# Patient Record
Sex: Male | Born: 1960 | Race: White | Hispanic: No | Smoking: Current every day smoker
Health system: Southern US, Community
[De-identification: ages and names within clinical notes are randomized; demographics above are authoritative.]

## PROBLEM LIST (undated history)

## (undated) DIAGNOSIS — Z972 Presence of dental prosthetic device (complete) (partial): Secondary | ICD-10-CM

## (undated) DIAGNOSIS — M1990 Unspecified osteoarthritis, unspecified site: Secondary | ICD-10-CM

## (undated) DIAGNOSIS — G4730 Sleep apnea, unspecified: Secondary | ICD-10-CM

## (undated) DIAGNOSIS — I10 Essential (primary) hypertension: Secondary | ICD-10-CM

## (undated) DIAGNOSIS — F419 Anxiety disorder, unspecified: Secondary | ICD-10-CM

## (undated) DIAGNOSIS — F319 Bipolar disorder, unspecified: Secondary | ICD-10-CM

## (undated) DIAGNOSIS — E6609 Other obesity due to excess calories: Secondary | ICD-10-CM

## (undated) DIAGNOSIS — E785 Hyperlipidemia, unspecified: Secondary | ICD-10-CM

## (undated) DIAGNOSIS — N529 Male erectile dysfunction, unspecified: Secondary | ICD-10-CM

## (undated) DIAGNOSIS — M5440 Lumbago with sciatica, unspecified side: Secondary | ICD-10-CM

## (undated) DIAGNOSIS — F329 Major depressive disorder, single episode, unspecified: Secondary | ICD-10-CM

## (undated) DIAGNOSIS — J45909 Unspecified asthma, uncomplicated: Secondary | ICD-10-CM

## (undated) DIAGNOSIS — F32A Depression, unspecified: Secondary | ICD-10-CM

## (undated) HISTORY — PX: HERNIA REPAIR: SHX51

## (undated) HISTORY — DX: Other obesity due to excess calories: E66.09

## (undated) HISTORY — PX: WRIST SURGERY: SHX841

## (undated) HISTORY — PX: JOINT REPLACEMENT: SHX530

## (undated) HISTORY — DX: Male erectile dysfunction, unspecified: N52.9

## (undated) HISTORY — PX: OTHER SURGICAL HISTORY: SHX169

## (undated) HISTORY — PX: BACK SURGERY: SHX140

## (undated) HISTORY — DX: Lumbago with sciatica, unspecified side: M54.40

## (undated) HISTORY — DX: Essential (primary) hypertension: I10

---

## 1998-01-19 ENCOUNTER — Emergency Department (HOSPITAL_COMMUNITY): Admission: EM | Admit: 1998-01-19 | Discharge: 1998-01-19 | Payer: Self-pay | Admitting: Emergency Medicine

## 1998-06-02 ENCOUNTER — Emergency Department (HOSPITAL_COMMUNITY): Admission: EM | Admit: 1998-06-02 | Discharge: 1998-06-02 | Payer: Self-pay | Admitting: Emergency Medicine

## 1998-06-02 ENCOUNTER — Encounter: Payer: Self-pay | Admitting: Emergency Medicine

## 2002-03-14 ENCOUNTER — Ambulatory Visit (HOSPITAL_BASED_OUTPATIENT_CLINIC_OR_DEPARTMENT_OTHER): Admission: RE | Admit: 2002-03-14 | Discharge: 2002-03-14 | Payer: Self-pay | Admitting: Orthopedic Surgery

## 2003-12-26 ENCOUNTER — Emergency Department (HOSPITAL_COMMUNITY): Admission: EM | Admit: 2003-12-26 | Discharge: 2003-12-26 | Payer: Self-pay | Admitting: Emergency Medicine

## 2004-03-10 ENCOUNTER — Ambulatory Visit (HOSPITAL_COMMUNITY): Admission: RE | Admit: 2004-03-10 | Discharge: 2004-03-10 | Payer: Self-pay | Admitting: Orthopaedic Surgery

## 2004-03-10 ENCOUNTER — Ambulatory Visit (HOSPITAL_BASED_OUTPATIENT_CLINIC_OR_DEPARTMENT_OTHER): Admission: RE | Admit: 2004-03-10 | Discharge: 2004-03-10 | Payer: Self-pay | Admitting: Orthopaedic Surgery

## 2004-05-28 ENCOUNTER — Encounter: Admission: RE | Admit: 2004-05-28 | Discharge: 2004-05-28 | Payer: Self-pay | Admitting: Neurosurgery

## 2006-04-07 ENCOUNTER — Inpatient Hospital Stay (HOSPITAL_COMMUNITY): Admission: RE | Admit: 2006-04-07 | Discharge: 2006-04-09 | Payer: Self-pay | Admitting: Neurosurgery

## 2006-06-29 ENCOUNTER — Encounter: Admission: RE | Admit: 2006-06-29 | Discharge: 2006-06-29 | Payer: Self-pay | Admitting: Neurosurgery

## 2007-03-03 ENCOUNTER — Ambulatory Visit (HOSPITAL_COMMUNITY): Admission: RE | Admit: 2007-03-03 | Discharge: 2007-03-03 | Payer: Self-pay | Admitting: Specialist

## 2007-06-30 ENCOUNTER — Encounter: Admission: RE | Admit: 2007-06-30 | Discharge: 2007-06-30 | Payer: Self-pay | Admitting: Specialist

## 2007-08-11 ENCOUNTER — Encounter: Admission: RE | Admit: 2007-08-11 | Discharge: 2007-08-11 | Payer: Self-pay | Admitting: Specialist

## 2007-08-23 ENCOUNTER — Encounter: Admission: RE | Admit: 2007-08-23 | Discharge: 2007-08-23 | Payer: Self-pay | Admitting: Specialist

## 2007-11-03 ENCOUNTER — Inpatient Hospital Stay (HOSPITAL_COMMUNITY): Admission: RE | Admit: 2007-11-03 | Discharge: 2007-11-04 | Payer: Self-pay | Admitting: Neurosurgery

## 2008-04-17 ENCOUNTER — Encounter: Admission: RE | Admit: 2008-04-17 | Discharge: 2008-04-17 | Payer: Self-pay | Admitting: Neurosurgery

## 2008-12-02 ENCOUNTER — Encounter: Admission: RE | Admit: 2008-12-02 | Discharge: 2008-12-02 | Payer: Self-pay | Admitting: Neurosurgery

## 2009-04-21 ENCOUNTER — Encounter: Admission: RE | Admit: 2009-04-21 | Discharge: 2009-04-21 | Payer: Self-pay | Admitting: Neurosurgery

## 2010-11-24 NOTE — Op Note (Signed)
NAME:  Peter [name], Peter [name]               ACCOUNT NO.:  [id number]   MEDICAL RECORD NO.:  [id number]          PATIENT TYPE:  INP   LOCATION:  3037                         FACILITY:  [facility]   PHYSICIAN:  Reinaldo Meeker, M.D. 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   PROCEDURE:  Exploration of fusion L4-L5 and L5-S1 and exploration of  hardware L4-L5 and L5-S1 and posterolateral fusion L4-L5 and L5-S1 with  BMP and Osteocel plus.   SURGEON:  Reinaldo Meeker, M.D.   ASSISTANT:  Donalee Citrin, M.D.   PROCEDURE IN DETAIL:  After being placed in the prone position, the  patient's back was prepped and draped in the usual sterile fashion.  The  previous lumbar incision was reopened and carried down towards the  fascia.  Dissection was then carried out laterally where the screw heads  and rod could be palpated.  The entire instrumentation system  bilaterally was dissected free of all soft tissue so it could be easily  evaluated including the crosslink.  The sacrum and far lateral region  was also dissected free of soft tissue so that the transverse processes  of L4-L5 and the far lateral region of S1 could be identified  bilaterally.  There was found to be a great amount the posterolateral  bony overgrowth which was actually encompassing the screws, particularly  at S1 on the left and L5-S1 on the left.  There was also a nice bony  fusion mass growing along the lateral aspect of L4-L5 and L5-S1 on the  right.  The screws were tested and found to be very solid with no  evidence of instability at either level.  It was therefore elected to  remove the bone from off the screws and remove the instrumentation  system which appeared to be well incorporated rather to redo and extend  the  posterolateral fusion.  Therefore, the transverse processes of L4  and L5 bilaterally were decorticated along with the far lateral region  of the sacrum.  The fusion bony mass was also mildly decorticated.  Once  this was done, large amounts of irrigation were carried out.  A  combination of BMP and Osteocel plus was then placed in the far lateral  region for posterolateral fusion from L4-L5 and L5-S1 bilaterally.  At  this time, the self-retaining retractor was removed, and a drain left in  the epidural space and brought out through a separate stab wound  incision.  The wound was then closed in multiple layers of Vicryl on the  muscle, fascia, subcutaneous, and subcuticular tissues.  Staples were  placed on the skin.  A sterile dressing was then applied, and the  patient was extubated and taken to the recovery room in stable  condition.           ______________________________  Reinaldo Meeker, M.D.     ROK/MEDQ  D:  11/03/2007  T:  11/04/2007  Job:  [id number]

## 2010-11-27 NOTE — Op Note (Signed)
NAME:  Peter [name], Peter [name]                         ACCOUNT NO.:  [id number]   MEDICAL RECORD NO.:  [id number]                   PATIENT TYPE:  AMB   LOCATION:  DSC                                  FACILITY:  [facility]   PHYSICIAN:  Loreta Ave, M.D.              DATE OF BIRTH:  Mar 29, 1961   DATE OF PROCEDURE:  03/15/2002  DATE OF DISCHARGE:                                 OPERATIVE REPORT   PREOPERATIVE DIAGNOSIS:  Puncture left forearm with residual compression  median nerve mid forearm volarly.  Also carpal tunnel syndrome.   POSTOPERATIVE DIAGNOSIS:  Puncture left forearm with residual compression  median nerve mid forearm volarly.  Also carpal tunnel syndrome.   OPERATION:  1. Left carpal tunnel release.  2. Expiration decompression median nerve mid forearm volarly at site of     puncture wound.   SURGEON:  Loreta Ave, M.D.   ASSISTANT:  Arlys John D. Petrarca, P.[name].-C.   ANESTHESIA:  General   ESTIMATED BLOOD LOSS:  Minimal   TOURNIQUET TIME:  35 minutes   SPECIMENS:  None   CULTURES:  None.   COMPLICATIONS:  None   DRESSING:  Soft compressive with bulky hand dressing and splint.   DESCRIPTION OF PROCEDURE:  The patient was brought to the operating room and  after adequate anesthesia had been obtained.  The left arm was prepped and  draped in the usual sterile fashion with [name] tourniquet on the upper aspect of  the arm.  Exsanguinated with elevation of an Esmarch and tourniquet inflated  to 250 mmHg.  Attention was turned to the carpal tunnel first.  [name] small  curved incision made over the carpal tunnel adjacent to the thenar eminence  heading slightly ulnarward at the distal ridge crease.  Skin and  subcutaneous tissue was divided.  Carpal tunnel decompressed in its entirety  with release of the overlying fascia from the palmar arch distally to the  forearm proximally.  Some constriction on the nerve but improved after  decompression.  Digital branch and motor  branches were identified and  protected decompressed.  No other abnormal findings.  The wound was  irrigated.  At the site of the puncture wound which was over the volar  aspect of the forearm junction middle distal third, I made [name] longitudinal  incision right over the area of puncture wound.  Skin and subcutaneous  tissue was divided.  The muscle from the superficial flexors was just below  this area and was detected.  This was mobilized and retracted exposing the  median nerve below this.  There was some scarring and fibrotic bands over  the nerve in that area.  All of this thoroughly decompressed.  I exposed the  nerve distal and proximal to be sure there was no other constriction on the  nerve itself.  There was no evidence of injury to the nerve in regards to  penetration  from its previous trauma.  After I felt that we had completely  decompressed the area, the wound was irrigated.  Both wounds were then  closed at the skin level with nylon.  Margins of the wound injected with  Marcaine.  Sterile compressive dressing with short arm splint and bulky hand  dressing applied.  Tourniquet then removed.  Anesthesia reversed.  Brought  to the recovery room.  Tolerated the surgery well with no complications.                                                Loreta Ave, M.D.    DFM/MEDQ  D:  03/15/2002  T:  03/15/2002  Job:  [id number]

## 2010-11-27 NOTE — Op Note (Signed)
NAME:  Peter [name], Peter [name]                         ACCOUNT NO.:  [id number]   MEDICAL RECORD NO.:  [id number]                   PATIENT TYPE:  AMB   LOCATION:  DSC                                  FACILITY:  [facility]   PHYSICIAN:  Lubertha Basque. Jerl Santos, M.D.             DATE OF BIRTH:  Feb 21, 1961   DATE OF PROCEDURE:  03/10/2004  DATE OF DISCHARGE:                                 OPERATIVE REPORT   PREOPERATIVE DIAGNOSIS:  Left knee torn lateral meniscus.   POSTOPERATIVE DIAGNOSIS:  Left knee torn lateral meniscus.   PROCEDURE:  Left knee arthroscopic partial lateral meniscectomy.   ANESTHESIA:  General.   SURGEON:  Lubertha Basque. Jerl Santos, M.D.   ASSISTANT:  Lindwood Qua, P.[name].   INDICATIONS FOR PROCEDURE:  The patient is [name] 50 year old man with several  months of left knee pain.  This has persisted despite oral anti-  inflammatories and activity restriction. He has undergone an MRI scan which  shows [name] lateral meniscus tear.  He has pain at rest and pain with activities  and was offered an arthroscopy.  Informed operative consent was obtained  after discussion of the possible complications of, reactions to anesthesia  and infection.   DESCRIPTION OF PROCEDURE:  The patient was taken to the operating suite  where general anesthetic was applied without difficulty.  He was positioned  supine and prepped and draped in normal sterile fashion.  After the  administration of preoperative antibiotics, an arthroscopy of left knee was  performed through two inferior portals.  The suprapatellar pouch was benign  as was the patellofemoral joint.  The medial compartment exhibited no  evidence of meniscal articular cartilage injury and the ACL was intact.  In  the lateral compartment, he had [name] displaced tear of the lateral meniscus  posterior and middle horn. [name] 15% partial lateral meniscectomy was performed  back to stable tissues.  This was done with the shaver and baskets.  He had  no degenerative  changes in this compartment.  The knee was thoroughly  irrigated followed by placement of Marcaine with epinephrine and morphine.  Adaptic was placed over the portals followed by dry gauze and [name] loose ace  wrap.  Estimated blood loss and intraoperative fluids can be obtained from  the anesthesia records.   DISPOSITION:  The patient was extubated in the operating room and taken to  the recovery room stable.  Plans are for him to go home the same day and  follow-up in the office in less than [name] week.  I will contact him by phone  tonight.                                               Lubertha Basque Jerl Santos, M.D.    PGD/MEDQ  D:  03/10/2004  T:  03/10/2004  Job:  [id number]

## 2010-11-27 NOTE — Op Note (Signed)
NAME:  Peter [name], Peter [name]               ACCOUNT NO.:  [id number]   MEDICAL RECORD NO.:  [id number]          PATIENT TYPE:  INP   LOCATION:  2899                         FACILITY:  [facility]   PHYSICIAN:  Reinaldo Meeker, M.D. 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   POSTOPERATIVE DIAGNOSIS:  Herniated disk and degenerative disease, L4-5, L5-  S1.   PROCEDURES:  1. L4-5 and L5-S1 decompressive laminectomy, followed by bilateral L4-5      and L5-S1 microdiskectomy, followed by L4-5 and L5-S1 posterior lumbar      interbody fusion with a Nuvasive bony spacer and PEEK interbody cages      at each level, followed by segmental instrumentation, L4-5, L5-S1, with      pedicle screw instrumentation, followed by L4-5 and L5-S1      posterolateral fusion.  2. Microdissection, L4, L5 and S1 nerve roots, as well as L4-5 and L5-S1      disk space bilaterally.   SURGEON:  Reinaldo Meeker, MD   ASSESSMENT:  Tia Alert, MD   PROCEDURE IN DETAIL:  After being placed in the prone position, the  patient's back was prepped and draped in the usual sterile fashion.  Localizing x-ray was taken prior to incision to identify the appropriate  level.  A midline incision was made over the spinous process L3, L4, L5 and  S1.  Using Bovie cutting current, the incision was carried down the spinous  processes.  Subperiosteal dissection was then carried out bilaterally on the  spinous processes, lamina and facet joints and in the far lateral region to  identify the transverse processes of L4 and L5 bilaterally.  A self-  retaining retractor was placed for exposure and x-ray showed approach to the  appropriate level.  Interspinous ligament was removed at this time.  Spinous  processes were then removed of L4, L5 and S1 and the bone saved for  use  later in the case.  Starting on the patient's left side, a very generous  laminotomy was performed by removing the inferior one-half of the L4-5  lamina, the medial two-thirds of the facet joint and the superior one-half  of the S1 lamina.  It was then used to remove the inferior one-half of L4,  the medial two-thirds the L4-5 facet, and the residual L5 lamina.  Residual  bone was removed and saved for use later in the case, in the L4 nerve root  was identified and the L5 nerve retracted completely out its foramen and the  S1 nerve root tracked out as well.  A similar decompression was then carried  out on the opposite side, once again doing a complete laminectomy of L5 as  well as removing the inferior half of L4 and the superior half of S1.  Once  again the medial two-thirds of the facet joints were removed at L4-5 and L5-  S1.  At this time bilateral  microdiskectomy was carried out at L4-5 and L5-  S1.  A thorough disk space clean-out was carried out bilaterally while at  the same time great care was taken to avoid injury to the neural elements.  This was successfully done.  At this time the disk space was prepared for  posterior lumbar interbody fusion.  A 12 mm distractor was placed at L4-5.  On the opposite side a rotating scraper was used, followed by chiseling with  the 12 x 9-mm chisel.  A bony spacer was initially put in.  On the opposite  side a similar procedure was carried out.  Prior to placing the second  spacer, autologous bone graft and BMP were placed in the midline.  On the  second side a PEEK interbody spacer filled with Master Graft and autologous  bone was placed into good position.  This was confirmed with fluoroscopy.  Similar interbody fusion was then carried out at L5-S1.  Once again  distraction with a 12 mm distractor was carried out.  Once again a circular  cutter was carried out followed by chiseling with a 12 x 9-mm chisel.  Once  again a Nuvasive bony  spacer was placed on the first side and a PEEK  interbody cage on the second.  Once again, autologous bone graft and BMP  were placed in the midline prior to placing the second spacer.  At this time  pedicle screw fixation was carried out bilaterally.  Entry points were  chosen and pedicle awl passed through the pedicle in a lateral to medial  trajectory.  Tapping was then carried out and 45 mm screws were placed at L4  and L5 and a 35 mm screw at S1.  A similar procedure was then carried on the  opposite side.  Screws were followed down with fluoroscopy.  At this time  large amounts of irrigation were carried out.  A high-speed drill was used  to decorticate the transverse processes and lateral facet joint,  posterolateral fusion was performed by using a mixture of BMP, autologous  bone and Master Graft putty.  An appropriate-length rod was then chosen.  This was secured with top-loading nuts.  Screws at S1 were locked and  sequential compression was carried out as the screws at L5-L4 underwent  their final compression and final locking.  A cross-link was then chosen and  secured in the standard fashion.  Final fluoroscopy in AP and lateral  direction showed the screws, rods, interbody __________ to all be in good  position.  Large amounts of irrigation was carried out.  Gelfoam was placed  over the dura for hemostasis along with bipolar coagulation.  A Hemovac  drain was brought out through a separate stab incision and left in the  epidural space.  The wound was then closed in multiple layers of Vicryl on  the muscle, fascia, subcutaneous and subcuticular tissues.  Staples were  placed on the skin.  A sterile dressing was then applied and the patient was  extubated, taken to the recovery room  in stable condition.           ______________________________  Reinaldo Meeker, M.D.     ROK/MEDQ  D:  04/07/2006  T:  04/09/2006  Job:  [id number]

## 2011-04-06 LAB — TYPE AND SCREEN
ABO/RH(D): O POS
Antibody Screen: NEGATIVE

## 2011-04-06 LAB — CBC
HCT: 45.8
Hemoglobin: 15.8
MCHC: 34.4
MCV: 93
Platelets: 213
RBC: 4.92
RDW: 13.7
WBC: 9.1

## 2011-07-30 ENCOUNTER — Other Ambulatory Visit: Payer: Self-pay | Admitting: Family Medicine

## 2011-07-30 DIAGNOSIS — E291 Testicular hypofunction: Secondary | ICD-10-CM

## 2011-08-06 ENCOUNTER — Ambulatory Visit
Admission: RE | Admit: 2011-08-06 | Discharge: 2011-08-06 | Disposition: A | Discharge status: Home / Self Care | Payer: Medicaid Other | Source: Ambulatory Visit | Attending: Family Medicine | Admitting: Family Medicine

## 2011-08-06 DIAGNOSIS — E291 Testicular hypofunction: Secondary | ICD-10-CM

## 2011-08-06 MED ORDER — GADOBENATE DIMEGLUMINE 529 MG/ML IV SOLN
10.0000 mL | Freq: Once | INTRAVENOUS | Status: AC | PRN
  Administered 2011-08-06: 10 mL via INTRAVENOUS

## 2011-12-14 ENCOUNTER — Emergency Department (HOSPITAL_COMMUNITY): Payer: Medicare Other

## 2011-12-14 ENCOUNTER — Encounter (HOSPITAL_COMMUNITY): Payer: Self-pay | Admitting: Emergency Medicine

## 2011-12-14 ENCOUNTER — Emergency Department (HOSPITAL_COMMUNITY)
Admission: EM | Admit: 2011-12-14 | Discharge: 2011-12-14 | Disposition: A | Discharge status: Home / Self Care | Payer: Medicare Other | Attending: Emergency Medicine | Admitting: Emergency Medicine

## 2011-12-14 DIAGNOSIS — M5412 Radiculopathy, cervical region: Secondary | ICD-10-CM | POA: Insufficient documentation

## 2011-12-14 DIAGNOSIS — M25519 Pain in unspecified shoulder: Secondary | ICD-10-CM | POA: Insufficient documentation

## 2011-12-14 DIAGNOSIS — R209 Unspecified disturbances of skin sensation: Secondary | ICD-10-CM | POA: Insufficient documentation

## 2011-12-14 DIAGNOSIS — I10 Essential (primary) hypertension: Secondary | ICD-10-CM | POA: Insufficient documentation

## 2011-12-14 HISTORY — DX: Essential (primary) hypertension: I10

## 2011-12-14 MED ORDER — IBUPROFEN 800 MG PO TABS: 800.0000 mg | ORAL_TABLET | Freq: Three times a day (TID) | ORAL | Status: AC

## 2011-12-14 MED ORDER — PREDNISONE 20 MG PO TABS: 60.0000 mg | ORAL_TABLET | Freq: Every day | ORAL | Status: AC

## 2011-12-14 MED ORDER — OXYCODONE-ACETAMINOPHEN 5-325 MG PO TABS: 1.0000 | ORAL_TABLET | Freq: Four times a day (QID) | ORAL | Status: AC | PRN

## 2011-12-14 NOTE — Discharge Instructions (Signed)
Cervical Radiculopathy Cervical radiculopathy happens when a nerve in the neck is pinched or bruised by a slipped (herniated) disk or by arthritic changes in the bones of the cervical spine. This can occur due to an injury or as part of the normal aging process. Pressure on the cervical nerves can cause pain or numbness that runs from your neck all the way down into your arm and fingers. CAUSES  There are many possible causes, including:  Injury.   Muscle tightness in the neck from overuse.   Swollen, painful joints (arthritis).   Breakdown or degeneration in the bones and joints of the spine (spondylosis) due to aging.   Bone spurs that may develop near the cervical nerves.  SYMPTOMS  Symptoms include pain, weakness, or numbness in the affected arm and hand. Pain can be severe or irritating. Symptoms may be worse when extending or turning the neck. DIAGNOSIS  Your caregiver will ask about your symptoms and do a physical exam. He or she may test your strength and reflexes. X-rays, CT scans, and MRI scans may be needed in cases of injury or if the symptoms do not go away after a period of time. Electromyography (EMG) or nerve conduction testing may be done to study how your nerves and muscles are working. TREATMENT  Your caregiver may recommend certain exercises to help relieve your symptoms. Cervical radiculopathy can, and often does, get better with time and treatment. If your problems continue, treatment options may include:  Wearing a soft collar for short periods of time.   Physical therapy to strengthen the neck muscles.   Medicines, such as nonsteroidal anti-inflammatory drugs (NSAIDs), oral corticosteroids, or spinal injections.   Surgery. Different types of surgery may be done depending on the cause of your problems.  HOME CARE INSTRUCTIONS   Put ice on the affected area.   Put ice in a plastic bag.   Place a towel between your skin and the bag.   Leave the ice on for 15  to 20 minutes, 3 to 4 times a day or as directed by your caregiver.   Use a flat pillow when you sleep.   Only take over-the-counter or prescription medicines for pain, discomfort, or fever as directed by your caregiver.   If physical therapy was prescribed, follow your caregiver's directions.   If a soft collar was prescribed, use it as directed.  SEEK IMMEDIATE MEDICAL CARE IF:   Your pain gets much worse and cannot be controlled with medicines.   You have weakness or numbness in your hand, arm, face, or leg.   You have a high fever or a stiff, rigid neck.   You lose bowel or bladder control (incontinence).   You have trouble with walking, balance, or speaking.  MAKE SURE YOU:   Understand these instructions.   Will watch your condition.   Will get help right away if you are not doing well or get worse.  Document Released: 03/23/2001 Document Revised: 06/17/2011 Document Reviewed: 02/09/2011 [employer] Patient Information 2012 [employer]. 

## 2011-12-14 NOTE — ED Notes (Signed)
Onset 2-3 weeks ago right shoulder pain radiating to right upper extremity pain and numbness. Continued today pain now 7/10 numbness tingling.  Bilateral equal strong hand grasps.  Radial pulses bialteral +2 equal full sensation skin warm and dry.

## 2011-12-14 NOTE — ED Provider Notes (Signed)
History  Scribed for [employer]. Bernette Mayers, MD, the patient was seen in room STRE3/STRE3. This chart was scribed by Candelaria Stagers. The patient's care started at 1:03 PM    CSN: [id number]  Arrival date & time 12/14/11  1150   First MD Initiated Contact with Patient 12/14/11 1257      Chief Complaint  Patient presents with  . Shoulder Pain  . Numbness    HPI Peter [name] is a 51 y.o. male who presents to the Emergency Department complaining of right shoulder pain that started about 2 weeks ago starting in his neck and moving down his arm.  She states that the pain goes down his arm and is experiencing numbness sand tingling down to his fingertips.  Lifting his arm above his shoulders makes the pain better.  Pt denies injury or fall.  Pt is on disability from lower back surgery.  He has h/o of HTN.  He has taken pain medication with little relief of the shoulder pain.   Past Medical History  Diagnosis Date  . Hypertension     Past Surgical History  Procedure Date  . Wrist surgery   . Cosmetic surgery   . Back fusion     No family history on file.  History  Substance Use Topics  . Smoking status: Current Everyday Smoker  . Smokeless tobacco: Not on file  . Alcohol Use: No      Review of Systems  Musculoskeletal: Positive for arthralgias (right shoulder pain).  Neurological: Positive for numbness (numbness and tingling of right arm).  All other systems reviewed and are negative.    Allergies  Review of patient's allergies indicates no known allergies.  Home Medications   Current Outpatient Rx  Name Route Sig Dispense Refill  . AMITRIPTYLINE HCL 50 MG PO TABS Oral Take 50 mg by mouth at bedtime.    Marland Kitchen LISINOPRIL-HYDROCHLOROTHIAZIDE 20-25 MG PO TABS Oral Take 1 tablet by mouth daily.      BP 134/82  Pulse 57  Temp(Src) 97.4 F (36.3 C) (Oral)  Resp 18  SpO2 99%  Physical Exam  Nursing note and vitals reviewed. Constitutional: He is oriented to person,  place, and time. He appears well-developed and well-nourished. No distress.  HENT:  Head: Normocephalic and atraumatic.  Eyes: EOM are normal. Pupils are equal, round, and reactive to light.  Neck: Neck supple. No tracheal deviation present.  Cardiovascular: Normal rate and intact distal pulses.   Pulmonary/Chest: Effort normal. No respiratory distress.  Abdominal: Soft. He exhibits no distension.  Musculoskeletal: He exhibits tenderness (right shoulder).  Neurological: He is alert and oriented to person, place, and time.  Skin: Skin is warm and dry.  Psychiatric: He has a normal mood and affect. His behavior is normal.    ED Course  Procedures DIAGNOSTIC STUDIES: Oxygen Saturation is 99% on room air, normal by my interpretation.    COORDINATION OF CARE:     Labs Reviewed - No data to display Dg Shoulder Right  12/14/2011  *RADIOLOGY REPORT*  Clinical Data: Right shoulder pain, no injury  RIGHT SHOULDER - 2+ VIEW  Comparison: None.  Findings: No fracture or dislocation.  The glenohumeral and acromioclavicular joint spaces are preserved.  No evidence of calcific tendonitis.  Regional soft tissues are normal.  Limited visualization of the adjacent thorax is normal.  IMPRESSION: Normal radiographs of the right shoulder.  Original Report Authenticated By: Waynard Reeds, M.D.     No diagnosis found.  MDM  Pt with symptoms consistent with cervical radiculopathy. EKG and Xray ordered by Triage nurse. No concern for cardiac etiology. Symptoms ongoing for 2-3 weeks. Advised to followup with his prior spine surgeon, Dr. Gerlene Fee. Prednisone and Pain meds as needed.    Date: 12/14/2011  Rate: 54  Rhythm: sinus bradycardia  QRS Axis: normal  Intervals: normal  ST/T Wave abnormalities: normal  Conduction Disutrbances:none  Narrative Interpretation:   Old EKG Reviewed: unchanged    I personally performed the services described in the documentation, which were scribed in my  presence. The recorded information has been reviewed and considered.           Ishmel Acevedo B. Bernette Mayers, MD 12/14/11 1310

## 2011-12-22 ENCOUNTER — Other Ambulatory Visit: Payer: Self-pay | Admitting: Family Medicine

## 2011-12-22 DIAGNOSIS — M542 Cervicalgia: Secondary | ICD-10-CM

## 2011-12-22 DIAGNOSIS — R200 Anesthesia of skin: Secondary | ICD-10-CM

## 2011-12-23 ENCOUNTER — Ambulatory Visit
Admission: RE | Admit: 2011-12-23 | Discharge: 2011-12-23 | Disposition: A | Discharge status: Home / Self Care | Payer: Medicare Other | Source: Ambulatory Visit | Attending: Family Medicine | Admitting: Family Medicine

## 2011-12-23 DIAGNOSIS — R200 Anesthesia of skin: Secondary | ICD-10-CM

## 2011-12-23 DIAGNOSIS — M542 Cervicalgia: Secondary | ICD-10-CM

## 2012-02-16 ENCOUNTER — Encounter (HOSPITAL_COMMUNITY): Payer: Self-pay | Admitting: Respiratory Therapy

## 2012-02-22 DIAGNOSIS — M5412 Radiculopathy, cervical region: Secondary | ICD-10-CM

## 2012-02-22 HISTORY — DX: Radiculopathy, cervical region: M54.12

## 2012-02-23 ENCOUNTER — Encounter (HOSPITAL_COMMUNITY)
Admission: RE | Admit: 2012-02-23 | Discharge: 2012-02-23 | Disposition: A | Discharge status: Home / Self Care | Payer: Medicare Other | Source: Ambulatory Visit | Attending: Orthopedic Surgery | Admitting: Orthopedic Surgery

## 2012-02-23 ENCOUNTER — Encounter (HOSPITAL_COMMUNITY): Payer: Self-pay

## 2012-02-23 ENCOUNTER — Encounter (HOSPITAL_COMMUNITY): Admission: RE | Admit: 2012-02-23 | Payer: Medicare Other | Source: Ambulatory Visit

## 2012-02-23 ENCOUNTER — Ambulatory Visit (HOSPITAL_COMMUNITY): Admission: RE | Admit: 2012-02-23 | Payer: Medicare Other | Source: Ambulatory Visit

## 2012-02-23 ENCOUNTER — Encounter (HOSPITAL_COMMUNITY)
Admission: RE | Admit: 2012-02-23 | Discharge: 2012-02-23 | Disposition: A | Discharge status: Home / Self Care | Payer: Medicare Other | Source: Ambulatory Visit | Attending: Physician Assistant | Admitting: Physician Assistant

## 2012-02-23 LAB — BASIC METABOLIC PANEL
BUN: 10 mg/dL (ref 6–23)
CO2: 24 mEq/L (ref 19–32)
Calcium: 9.1 mg/dL (ref 8.4–10.5)
Chloride: 106 mEq/L (ref 96–112)
Creatinine, Ser: 0.96 mg/dL (ref 0.50–1.35)
GFR calc Af Amer: >90 mL/min (ref >90)
GFR calc non Af Amer: >90 mL/min (ref >90)
Glucose, Bld: 132 mg/dL — ABNORMAL HIGH (ref 70–99)
Potassium: 4.2 mEq/L (ref 3.5–5.1)
Sodium: 140 mEq/L (ref 135–145)

## 2012-02-23 LAB — CBC
HCT: 47.3 % (ref 39.0–52.0)
Hemoglobin: 16.6 g/dL (ref 13.0–17.0)
MCH: 33.7 pg (ref 26.0–34.0)
MCHC: 35.1 g/dL (ref 30.0–36.0)
MCV: 96.1 fL (ref 78.0–100.0)
Platelets: 196 10*3/uL (ref 150–400)
RBC: 4.92 MIL/uL (ref 4.22–5.81)
RDW: 14.8 % (ref 11.5–15.5)
WBC: 8.6 10*3/uL (ref 4.0–10.5)

## 2012-02-23 LAB — SURGICAL PCR SCREEN
MRSA, PCR: NEGATIVE
Staphylococcus aureus: NEGATIVE

## 2012-02-23 NOTE — Progress Notes (Signed)
Dr Jeannetta Nap called for current cxr.

## 2012-02-23 NOTE — Pre-Procedure Instructions (Signed)
20 Peter [name]  02/23/2012   Your procedure is scheduled on:  03/01/12  Report to [facility] at 1130 AM.  Call this number if you have problems the morning of surgery: [phone]   Remember:   Do not eat food:After Midnight.  Take these medicines the morning of surgery with A SIP OF WATER: *none   Do not wear jewelry, make-up or nail polish.  Do not wear lotions, powders, or perfumes. You may wear deodorant.  Do not shave 48 hours prior to surgery. Men may shave face and neck.  Do not bring valuables to the hospital.  Contacts, dentures or bridgework may not be worn into surgery.  Leave suitcase in the car. After surgery it may be brought to your room.  For patients admitted to the hospital, checkout time is 11:00 AM the day of discharge.   Patients discharged the day of surgery will not be allowed to drive home.  Name and phone number of your driver: family  Special Instructions: CHG Shower Use Special Wash: 1/2 bottle night before surgery and 1/2 bottle morning of surgery.   Please read over the following fact sheets that you were given: Pain Booklet, Coughing and Deep Breathing, Blood Transfusion Information, MRSA Information and Surgical Site Infection Prevention

## 2012-02-28 NOTE — H&P (Signed)
Peter [name] 02/21/2012 11:25 AM Location: [facility] Patient #: [id number] DOB: [birth year] Married / Language: [place] / Race: White Male   History of Present Illness(Christipher Rieger Dierdre Highman, PA-C; 02/26/2012 10:44 AM) The patient is a 51 year old male who comes in today for a preoperative History and Physical. The patient is scheduled for a ACDF C5-6, C6-7 for cervical radiculopathy (with right arm pain) to be performed by Dr. Debria Garret D. Shon Baton, MD at [facility] on Wednesday August 21 at 130PM . Please see the hospital record for complete dictated history and physical.    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 01/04/2012   Social History(Darl Brisbin J Aidin Doane, PA-C; 02/21/2012 11:26 AM) Tobacco use. Current every day smoker, Smokes 1 pack of cigarettes per day.   Medication History(Latessa Tillis J Aftan Vint, PA-C; 02/21/2012 11:29 AM) Amitriptyline HCl (50MG  Tablet, Oral two times daily) Active. Lisinopril-Hydrochlorothiazide (20-25MG  Tablet, Oral) Active.   Past Surgical History(Anushri Casalino J [facility], PA-C; 02/21/2012 12:02 PM) Spinal Fusion - Neck Spinal Fusion - Lower Back. x2 Arthroscopic Knee Surgery - Left Back Surgery. lumbar fusion x2 with disability  Note: Left arm   Vitals(Deundra Furber J Jaylenn Baiza, PA-C; 02/21/2012 11:26 AM) 02/21/2012 11:26 AM Weight: 222 lb Height: 71 in Body Surface Area: 2.25 m Body Mass Index: 30.96 kg/m  02/21/2012 11:25 AM Pulse: 68 (Regular) BP: 145/79 (Sitting, Left Arm, Standard)    Physical Exam(Brailyn Killion J Rakesh Dutko, PA-C; 02/26/2012 10:56 AM) The physical exam findings are as follows:   General General Appearance- pleasant. Not in acute distress. Orientation- Oriented X3. Build & Nutrition- Well nourished and Well developed. Posture- Normal posture.  Gait- Normal. Mental Status- Alert.   Integumentary General Characteristics:Surgical Scars- no surgical scar evidence of previous cervical surgery. Cervical Spine- Skin examination of the cervical spine is without deformity, skin lesions, lacerations or abrasions.   Head and Neck Neck Global Assessment- supple. no lymphadenopathy and no nucchal rigidty.   Eye Pupil- Bilateral- Normal, Direct reaction to light normal, Equal and Regular. Motion- Bilateral- EOMI.   Chest and Lung Exam Auscultation: Breath sounds:- Clear.   Cardiovascular Auscultation:Rhythm- Regular rate and rhythm. Heart Sounds- Normal heart sounds.   Abdomen Palpation/Percussion:Palpation and Percussion of the abdomen reveal - Non Tender, No Rebound tenderness and Soft.   Peripheral Vascular Upper Extremity: Palpation:Radial pulse- Bilateral- 2+.   Neurologic Sensation:Upper Extremity- Bilateral- sensation is intact in the upper extremity (decreased in C7 distribution on the right). Reflexes:Biceps Reflex- Bilateral- 2+. Brachioradialis Reflex- Bilateral- 2+. Triceps Reflex- Bilateral- 2+. Babinski- Bilateral- Babinski not present. Clonus- Bilateral- clonus not present. Hoffman's Sign- Bilateral- Hoffman's sign not present. Testing:Seated Straight Leg Raise- Bilateral- Seated straight leg raise negative.   Musculoskeletal Spine/Ribs/Pelvis Cervical Spine : Inspection and Palpation:Tenderness- no soft tissue tenderness to palpation and no bony tenderness to palpation. bony/soft tissue palpation of the cervical spine and shoulders does not recreate their typical pain. Strength and Tone: Strength:Deltoid- Bilateral- 5/5. Biceps- Bilateral- 5/5. Triceps- Left- 5/5. Right- 4/5. Wrist Extension- Bilateral- 5/5. Hand Grip- Bilateral- 5/5. Heel walk- Bilateral- able to heel walk without difficulty. Toe Walk- Bilateral- able to walk on toes  without difficulty. Heel-Toe Walk- Bilateral- able to heel-toe walk without difficulty. ROM- limited and painful. Pain:- neither flexion or extension is more painful than the other. Special Testing- axial compression test negative and cross chest impingement test negative. Non-Anatomic Signs- No non-anatomic signs present. Upper Extremity  Range of Motion:- No truesholder pain with IR/ER of the shoulders.   Assessment & Plan(Eurika Sandy J [facility], PA-C; 02/26/2012 10:51 AM) Cervical radiculopathy (723.4)  Note: unfortunately conservative measures consisting of observation, activity modifications and oral pain medications have failed to alleviate his symptoms and given the ongoing nature of his pain and the significant decrease in his quality of life, he wishes to proceed with surgery. Risks/benefits/alternatives to the procedure/expectations following the procedure have been reviewed with the patient by Dr. Shon Baton. He understands.   MRI of the cervical spine dated 12/23/11 demonstrates left foraminal encroachment at C5-6 from a disc protrusion and osteophyte. Right foraminal encroachment at C6-7 for the same reason. Please see the report for the specifics.   He is scheduled for his pre-op hospital and therapy visit for the collar on Wednesday. He has been medically cleared for surgery by Dr. Jeannetta Nap. All of his questions have been encouraged, addressed and answered. Plan, at this time, is to proceed with surgery as scheduled.   Signed electronically by Gwinda Maine, PA-C (02/26/2012 10:57 AM)  Peter [name] 01/24/2012 8:46 AM Location: [facility] Patient #: [id number] DOB: Mar 06, 1961 Married / Language: [place] / Race: White Male   History of Present Illness(Lori Zipporah Plants; 01/24/2012 8:57 AM) The patient is a 51 year old male who presents with neck pain. The patient is seen today in referral from Dr. Ethelene Hal . The patient reports symptoms involving the right lateral  neck which began 2 month(s) ago. The symptoms began without any known injury. Symptoms include neck pain (about the right lateral cervical region radiating into the right upper ext. to the level of the hand ), neck stiffness, numbness (right ) and weakness, while symptoms do not include headaches. Current treatment includes opioid analgesics (Morphine 60mg  per primary q 12hrs.), ice and heat. Prior to being seen today the patient was previously evaluated by a primary care provider (with referral to Dr. Ethelene Hal). Past evaluation has included cervical spine x-rays and cervical spine MRI (performed at GI ). Past treatment has included nonsteroidal anti-inflammatory drugs (Mobic ).    Subjective Transcription(DAHARI Sheela Stack, MD; 01/28/2012 8:48 AM)  REASON FOR CONSULTATION:  Neck and right arm pain.    Dinh is a very pleasant 51 year old gentleman who for the last two months has been having severe, progressive right arm pain and neck pain. He states that keeping his arm flexed at the elbow and over his head does relieve his symptoms. Anytime he has to reach out for something he gets horrific pain. This is preventing him from sleeping. He also notes weakness in the arm, difficulty holding onto things. As a result of this he initially saw my partner, Dr. Ethelene Hal, who discussed potential injection therapy but did not feel as though it would be helpful. Based on the MRI results he was referred to me for further work up and treatment.    The MRI was done on 12/23/11. It demonstrated a small left disc protrusion extending into the foramen with spurring and impingement of the left C6 nerve root. At C6-7 there was a disc herniation with more prominent foraminal stenosis but no cord signal change with C7 nerve compression on the right side.    Allergies(Lori W Randa Lynn; 01/24/2012 8:47 AM) No Known Drug Allergies. 01/04/2012   Social History(Lori W Randa Lynn; 01/24/2012 8:57 AM) Tobacco use. Smokes 1 pack of  cigarettes per day.   Medication History(Lori W Lamb; 01/24/2012 8:58 AM) Lisinopril-Hydrochlorothiazide (20-25MG  Tablet, Oral) Active. Amitriptyline  HCl (50MG  Tablet, Oral 2 TABS HS) Active. Morphine Sulfate ER (60MG  Tablet ER 12HR, Oral) Active. (q 12hrs)   Past Surgical History(Lori W Lamb; 01/24/2012 8:58 AM) Back Surgery. lumbar fusion x2 with disability   Objective Transcription(DAHARI Sheela Stack, MD; 01/28/2012 8:48 AM)  He is a pleasant gentleman who appears his stated age in no acute distress. He is alert and oriented times three. Downgoing toes on Babinski testing. No clonus. Symmetric deep tendon reflexes. Normal gait pattern. He has triceps weakness on the right side 4/5, numbness and dysesthesias in the C7 distribution on the right side. No neurological deficits on the left. The remainder of his muscle groups testing other than the triceps were intact. He has significant neck pain, positive Spurling's sign.    RADIOGRAPHS:  I did have x-rays which did show some slight degenerative changes at C5-6 and more advanced changes at C6-7.        Assessments Transcription(DAHARI D BROOKS, MD; 01/28/2012 8:48 AM)  At this point in time the patient's principle pain generator is the C6-7 level.    Plans Transcription(DAHARI Sheela Stack, MD; 01/28/2012 8:48 AM)  We did have a long discussion about management. He would like to proceed with surgery because of the loss of quality of life, pain and weakness. I do believe that he has a C7 radiculopathy that has been going on for greater than six weeks with no significant relief. At this point we have gone over the risks of surgery which include infection, bleeding, nerve damage, death, stroke, paralysis, failure to heal, need for further surgery, ongoing or worse pain, nonunion, hardware complications, throat pain, swallowing difficulties, hoarseness in the voice. The goal of surgery is reduction in his pain, improvement in his  strength. We have also had a long discussion about adjacent segment disease. I did tell him that by fusing the C6-7 level there is an increased stress transfer to the C5-6 level and it could break down and could be problematic in the future. However, at this point in time I do not think it is a pain generator since he does not have corresponding left cervical radiculopathy. After discussing the risks of one level versus two level he and his wife are very adamant about proceeding with both levels. I did tell him that even when we do two levels the level above that could break down. The 4-5 level is just showing a tiny central disc protrusion, no significant collapse. At this point in time he would like to proceed with a two level fusion. Given the fact that he is a positive nicotine user and he is having a multilevel procedure we will also use a bone stimulator postoperatively. All of the risks were reviewed. The patient was present for the dictation.      Miscellaneous Transcription(DAHARI Sheela Stack, MD; 01/28/2012 8:48 AM)  Alvy Beal, MD/MMK    T: 01/26/12  D: 01/24/12      Signed electronically by Alvy Beal, MD (01/28/2012 8:57 AM)

## 2012-02-29 MED ORDER — CEFAZOLIN SODIUM-DEXTROSE 2-3 GM-% IV SOLR
2.0000 g | INTRAVENOUS | Status: AC
  Administered 2012-03-01: 2 g via INTRAVENOUS
  Filled 2012-02-29: qty 50

## 2012-02-29 MED ORDER — LACTATED RINGERS IV SOLN
INTRAVENOUS | Status: DC
  Administered 2012-03-01: 13:00:00 via INTRAVENOUS

## 2012-03-01 ENCOUNTER — Encounter (HOSPITAL_COMMUNITY): Payer: Self-pay | Admitting: Certified Registered"

## 2012-03-01 ENCOUNTER — Encounter (HOSPITAL_COMMUNITY)
Admission: RE | Disposition: A | Discharge status: Home / Self Care | Payer: Self-pay | Source: Ambulatory Visit | Attending: Orthopedic Surgery

## 2012-03-01 ENCOUNTER — Ambulatory Visit (HOSPITAL_COMMUNITY): Payer: Medicare Other

## 2012-03-01 ENCOUNTER — Inpatient Hospital Stay (HOSPITAL_COMMUNITY): Payer: Medicare Other

## 2012-03-01 ENCOUNTER — Ambulatory Visit (HOSPITAL_COMMUNITY): Payer: Medicare Other | Admitting: Certified Registered"

## 2012-03-01 ENCOUNTER — Inpatient Hospital Stay (HOSPITAL_COMMUNITY)
Admission: RE | Admit: 2012-03-01 | Discharge: 2012-03-02 | DRG: 473 | Disposition: A | Discharge status: Home / Self Care | Payer: Medicare Other | Source: Ambulatory Visit | Attending: Orthopedic Surgery | Admitting: Orthopedic Surgery

## 2012-03-01 DIAGNOSIS — M5412 Radiculopathy, cervical region: Secondary | ICD-10-CM | POA: Diagnosis present

## 2012-03-01 DIAGNOSIS — M47812 Spondylosis without myelopathy or radiculopathy, cervical region: Principal | ICD-10-CM | POA: Diagnosis present

## 2012-03-01 DIAGNOSIS — M5030 Other cervical disc degeneration, unspecified cervical region: Secondary | ICD-10-CM | POA: Diagnosis present

## 2012-03-01 DIAGNOSIS — F17200 Nicotine dependence, unspecified, uncomplicated: Secondary | ICD-10-CM | POA: Diagnosis present

## 2012-03-01 HISTORY — PX: ANTERIOR CERVICAL DECOMP/DISCECTOMY FUSION: SHX1161

## 2012-03-01 SURGERY — ANTERIOR CERVICAL DECOMPRESSION/DISCECTOMY FUSION 2 LEVELS
Anesthesia: General | Site: Neck | Wound class: Clean

## 2012-03-01 MED ORDER — THROMBIN 20000 UNITS EX SOLR
CUTANEOUS | Status: AC
  Filled 2012-03-01: qty 20000

## 2012-03-01 MED ORDER — 0.9 % SODIUM CHLORIDE (POUR BTL) OPTIME
TOPICAL | Status: DC | PRN
  Administered 2012-03-01: 1000 mL

## 2012-03-01 MED ORDER — MORPHINE SULFATE 2 MG/ML IJ SOLN: 1.0000 mg | INTRAMUSCULAR | Status: DC | PRN

## 2012-03-01 MED ORDER — HYDROMORPHONE HCL PF 1 MG/ML IJ SOLN
0.2500 mg | INTRAMUSCULAR | Status: DC | PRN
  Administered 2012-03-01 (×4): 0.5 mg via INTRAVENOUS

## 2012-03-01 MED ORDER — SODIUM CHLORIDE 0.9 % IJ SOLN
3.0000 mL | Freq: Two times a day (BID) | INTRAMUSCULAR | Status: DC
  Administered 2012-03-01: 3 mL via INTRAVENOUS

## 2012-03-01 MED ORDER — GLYCOPYRROLATE 0.2 MG/ML IJ SOLN
INTRAMUSCULAR | Status: DC | PRN
  Administered 2012-03-01: .5 mg via INTRAVENOUS

## 2012-03-01 MED ORDER — ZOLPIDEM TARTRATE 5 MG PO TABS: 5.0000 mg | ORAL_TABLET | Freq: Every evening | ORAL | Status: DC | PRN

## 2012-03-01 MED ORDER — HYDROMORPHONE HCL PF 1 MG/ML IJ SOLN
INTRAMUSCULAR | Status: AC
  Filled 2012-03-01: qty 1

## 2012-03-01 MED ORDER — AMITRIPTYLINE HCL 100 MG PO TABS
100.0000 mg | ORAL_TABLET | Freq: Every day | ORAL | Status: DC
  Administered 2012-03-01: 100 mg via ORAL
  Filled 2012-03-01 (×2): qty 1

## 2012-03-01 MED ORDER — MIDAZOLAM HCL 5 MG/5ML IJ SOLN
INTRAMUSCULAR | Status: DC | PRN
  Administered 2012-03-01: 2 mg via INTRAVENOUS

## 2012-03-01 MED ORDER — FENTANYL CITRATE 0.05 MG/ML IJ SOLN
INTRAMUSCULAR | Status: DC | PRN
  Administered 2012-03-01: 100 ug via INTRAVENOUS
  Administered 2012-03-01 (×6): 50 ug via INTRAVENOUS
  Administered 2012-03-01: 100 ug via INTRAVENOUS

## 2012-03-01 MED ORDER — PROPOFOL 10 MG/ML IV EMUL
INTRAVENOUS | Status: DC | PRN
  Administered 2012-03-01: 120 mg via INTRAVENOUS

## 2012-03-01 MED ORDER — PHENOL 1.4 % MT LIQD: 1.0000 | OROMUCOSAL | Status: DC | PRN

## 2012-03-01 MED ORDER — LACTATED RINGERS IV SOLN: INTRAVENOUS | Status: DC

## 2012-03-01 MED ORDER — DEXAMETHASONE 4 MG PO TABS
4.0000 mg | ORAL_TABLET | Freq: Four times a day (QID) | ORAL | Status: DC
  Administered 2012-03-02: 4 mg via ORAL
  Filled 2012-03-01 (×5): qty 1

## 2012-03-01 MED ORDER — METHOCARBAMOL 100 MG/ML IJ SOLN
500.0000 mg | Freq: Four times a day (QID) | INTRAVENOUS | Status: DC | PRN
  Administered 2012-03-01: 500 mg via INTRAVENOUS
  Filled 2012-03-01: qty 5

## 2012-03-01 MED ORDER — SODIUM CHLORIDE 0.9 % IJ SOLN: 3.0000 mL | INTRAMUSCULAR | Status: DC | PRN

## 2012-03-01 MED ORDER — MENTHOL 3 MG MT LOZG: 1.0000 | LOZENGE | OROMUCOSAL | Status: DC | PRN

## 2012-03-01 MED ORDER — HYDROCHLOROTHIAZIDE 25 MG PO TABS
25.0000 mg | ORAL_TABLET | Freq: Every day | ORAL | Status: DC
  Administered 2012-03-01 – 2012-03-02 (×2): 25 mg via ORAL
  Filled 2012-03-01 (×2): qty 1

## 2012-03-01 MED ORDER — METHOCARBAMOL 500 MG PO TABS
500.0000 mg | ORAL_TABLET | Freq: Four times a day (QID) | ORAL | Status: DC | PRN
  Administered 2012-03-01: 500 mg via ORAL
  Filled 2012-03-01 (×2): qty 1

## 2012-03-01 MED ORDER — NEOSTIGMINE METHYLSULFATE 1 MG/ML IJ SOLN
INTRAMUSCULAR | Status: DC | PRN
  Administered 2012-03-01: 2.5 mg via INTRAVENOUS

## 2012-03-01 MED ORDER — OXYCODONE HCL 5 MG PO TABS
10.0000 mg | ORAL_TABLET | ORAL | Status: DC | PRN
  Administered 2012-03-01 – 2012-03-02 (×3): 10 mg via ORAL
  Filled 2012-03-01 (×3): qty 2

## 2012-03-01 MED ORDER — SURGIFOAM 100 EX MISC
CUTANEOUS | Status: DC | PRN
  Administered 2012-03-01: 15:00:00 via TOPICAL

## 2012-03-01 MED ORDER — LISINOPRIL-HYDROCHLOROTHIAZIDE 20-25 MG PO TABS: 1.0000 | ORAL_TABLET | Freq: Every day | ORAL | Status: DC

## 2012-03-01 MED ORDER — ONDANSETRON HCL 4 MG/2ML IJ SOLN
INTRAMUSCULAR | Status: DC | PRN
  Administered 2012-03-01: 4 mg via INTRAVENOUS

## 2012-03-01 MED ORDER — LISINOPRIL 20 MG PO TABS
20.0000 mg | ORAL_TABLET | Freq: Every day | ORAL | Status: DC
  Administered 2012-03-01 – 2012-03-02 (×2): 20 mg via ORAL
  Filled 2012-03-01 (×2): qty 1

## 2012-03-01 MED ORDER — BUPIVACAINE-EPINEPHRINE PF 0.25-1:200000 % IJ SOLN
INTRAMUSCULAR | Status: AC
  Filled 2012-03-01: qty 30

## 2012-03-01 MED ORDER — ACETAMINOPHEN 10 MG/ML IV SOLN
1000.0000 mg | Freq: Four times a day (QID) | INTRAVENOUS | Status: DC
  Administered 2012-03-01 – 2012-03-02 (×3): 1000 mg via INTRAVENOUS
  Filled 2012-03-01 (×4): qty 100

## 2012-03-01 MED ORDER — LACTATED RINGERS IV SOLN
INTRAVENOUS | Status: DC | PRN
  Administered 2012-03-01 (×3): via INTRAVENOUS

## 2012-03-01 MED ORDER — DEXAMETHASONE SODIUM PHOSPHATE 10 MG/ML IJ SOLN
10.0000 mg | Freq: Once | INTRAMUSCULAR | Status: AC
  Administered 2012-03-01: 10 mg via INTRAVENOUS

## 2012-03-01 MED ORDER — LIDOCAINE HCL (CARDIAC) 20 MG/ML IV SOLN
INTRAVENOUS | Status: DC | PRN
  Administered 2012-03-01: 100 mg via INTRAVENOUS

## 2012-03-01 MED ORDER — DEXAMETHASONE SODIUM PHOSPHATE 4 MG/ML IJ SOLN
4.0000 mg | Freq: Four times a day (QID) | INTRAMUSCULAR | Status: DC
  Administered 2012-03-01: 4 mg via INTRAVENOUS
  Filled 2012-03-01 (×5): qty 1

## 2012-03-01 MED ORDER — CEFAZOLIN SODIUM 1-5 GM-% IV SOLN
1.0000 g | Freq: Three times a day (TID) | INTRAVENOUS | Status: AC
  Administered 2012-03-01 – 2012-03-02 (×2): 1 g via INTRAVENOUS
  Filled 2012-03-01 (×2): qty 50

## 2012-03-01 MED ORDER — PHENYLEPHRINE HCL 10 MG/ML IJ SOLN
INTRAMUSCULAR | Status: DC | PRN
  Administered 2012-03-01 (×2): 80 ug via INTRAVENOUS

## 2012-03-01 MED ORDER — BUPIVACAINE-EPINEPHRINE PF 0.25-1:200000 % IJ SOLN
INTRAMUSCULAR | Status: DC | PRN
  Administered 2012-03-01: 4 mL

## 2012-03-01 MED ORDER — ACETAMINOPHEN 10 MG/ML IV SOLN
INTRAVENOUS | Status: AC
  Filled 2012-03-01: qty 100

## 2012-03-01 MED ORDER — SODIUM CHLORIDE 0.9 % IV SOLN: 250.0000 mL | INTRAVENOUS | Status: DC

## 2012-03-01 MED ORDER — ACETAMINOPHEN 10 MG/ML IV SOLN
1000.0000 mg | Freq: Once | INTRAVENOUS | Status: AC
  Administered 2012-03-01: 1000 mg via INTRAVENOUS

## 2012-03-01 MED ORDER — ONDANSETRON HCL 4 MG/2ML IJ SOLN: 4.0000 mg | Freq: Once | INTRAMUSCULAR | Status: DC | PRN

## 2012-03-01 MED ORDER — ROCURONIUM BROMIDE 100 MG/10ML IV SOLN
INTRAVENOUS | Status: DC | PRN
  Administered 2012-03-01: 50 mg via INTRAVENOUS

## 2012-03-01 MED ORDER — PNEUMOCOCCAL VAC POLYVALENT 25 MCG/0.5ML IJ INJ
0.5000 mL | INJECTION | INTRAMUSCULAR | Status: AC
  Administered 2012-03-02: 0.5 mL via INTRAMUSCULAR
  Filled 2012-03-01 (×2): qty 0.5

## 2012-03-01 MED ORDER — ONDANSETRON HCL 4 MG/2ML IJ SOLN: 4.0000 mg | INTRAMUSCULAR | Status: DC | PRN

## 2012-03-01 SURGICAL SUPPLY — 67 items
ADH SKN CLS APL DERMABOND .7 (GAUZE/BANDAGES/DRESSINGS) ×1
BLADE SURG ROTATE 9660 (MISCELLANEOUS) IMPLANT
BUR EGG ELITE 4.0 (BURR) IMPLANT
BUR MATCHSTICK NEURO 3.0 LAGG (BURR) IMPLANT
CANISTER SUCTION 2500CC (MISCELLANEOUS) ×2 IMPLANT
CLOTH BEACON ORANGE TIMEOUT ST (SAFETY) ×2 IMPLANT
CLSR STERI-STRIP ANTIMIC 1/2X4 (GAUZE/BANDAGES/DRESSINGS) ×1 IMPLANT
COLLAR CERV LO CONTOUR FIRM DE (SOFTGOODS) IMPLANT
CORDS BIPOLAR (ELECTRODE) ×2 IMPLANT
COVER SURGICAL LIGHT HANDLE (MISCELLANEOUS) ×4 IMPLANT
CRADLE DONUT ADULT HEAD (MISCELLANEOUS) ×2 IMPLANT
DERMABOND ADVANCED (GAUZE/BANDAGES/DRESSINGS) ×1
DERMABOND ADVANCED .7 DNX12 (GAUZE/BANDAGES/DRESSINGS) ×1 IMPLANT
DEVICE ENDSKLTN TC MED 8MM (Orthopedic Implant) IMPLANT
DRAPE C-ARM 42X72 X-RAY (DRAPES) ×2 IMPLANT
DRAPE POUCH INSTRU U-SHP 10X18 (DRAPES) ×2 IMPLANT
DRAPE SURG 17X23 STRL (DRAPES) ×2 IMPLANT
DRAPE U-SHAPE 47X51 STRL (DRAPES) ×2 IMPLANT
DRSG MEPILEX BORDER 4X4 (GAUZE/BANDAGES/DRESSINGS) ×2 IMPLANT
DURAPREP 26ML APPLICATOR (WOUND CARE) ×2 IMPLANT
ELECT COATED BLADE 2.86 ST (ELECTRODE) ×2 IMPLANT
ELECT REM PT RETURN 9FT ADLT (ELECTROSURGICAL) ×2
ELECTRODE REM PT RTRN 9FT ADLT (ELECTROSURGICAL) ×1 IMPLANT
ENDOSKELTON TC IMPLANT 8MM MED (Orthopedic Implant) ×2 IMPLANT
GLOVE BIOGEL PI IND STRL 6.5 (GLOVE) ×1 IMPLANT
GLOVE BIOGEL PI IND STRL 8.5 (GLOVE) ×1 IMPLANT
GLOVE BIOGEL PI INDICATOR 6.5 (GLOVE) ×1
GLOVE BIOGEL PI INDICATOR 8.5 (GLOVE) ×1
GLOVE ECLIPSE 6.0 STRL STRAW (GLOVE) ×2 IMPLANT
GLOVE ECLIPSE 8.5 STRL (GLOVE) ×2 IMPLANT
GOWN PREVENTION PLUS XXLARGE (GOWN DISPOSABLE) ×2 IMPLANT
GOWN STRL NON-REIN LRG LVL3 (GOWN DISPOSABLE) ×4 IMPLANT
INTERLOCK LRDTC CRVCL VBR 8MM (Peek) IMPLANT
KIT BASIN OR (CUSTOM PROCEDURE TRAY) ×2 IMPLANT
KIT ROOM TURNOVER OR (KITS) ×2 IMPLANT
LORDOTIC CERVICAL VBR 8MM SM (Peek) ×2 IMPLANT
NDL SPNL 18GX3.5 QUINCKE PK (NEEDLE) ×1 IMPLANT
NEEDLE SPNL 18GX3.5 QUINCKE PK (NEEDLE) ×2 IMPLANT
NS IRRIG 1000ML POUR BTL (IV SOLUTION) ×2 IMPLANT
PACK ORTHO CERVICAL (CUSTOM PROCEDURE TRAY) ×2 IMPLANT
PACK UNIVERSAL I (CUSTOM PROCEDURE TRAY) ×2 IMPLANT
PAD ARMBOARD 7.5X6 YLW CONV (MISCELLANEOUS) ×4 IMPLANT
PLATE VECTRA 34MM (Plate) ×1 IMPLANT
PUTTY BONE DBX 5CC MIX (Putty) ×1 IMPLANT
RETAINER PIN ×2 IMPLANT
SCREW 4.0X14MM (Screw) ×8 IMPLANT
SCREW 4.0X16MM (Screw) ×2 IMPLANT
SCREW BN 14X4XSLF DRL VA SLF (Screw) IMPLANT
SPONGE INTESTINAL PEANUT (DISPOSABLE) IMPLANT
SPONGE LAP 4X18 X RAY DECT (DISPOSABLE) IMPLANT
SPONGE SURGIFOAM ABS GEL 100 (HEMOSTASIS) ×2 IMPLANT
STRIP CLOSURE SKIN 1/2X4 (GAUZE/BANDAGES/DRESSINGS) ×2 IMPLANT
SURGIFLO TRUKIT (HEMOSTASIS) IMPLANT
SUT MNCRL AB 3-0 PS2 18 (SUTURE) ×2 IMPLANT
SUT SILK 2 0 (SUTURE) ×2
SUT SILK 2-0 18XBRD TIE 12 (SUTURE) ×1 IMPLANT
SUT VIC AB 2-0 CT1 18 (SUTURE) ×2 IMPLANT
SUT VIC AB 3-0 54X BRD REEL (SUTURE) IMPLANT
SUT VIC AB 3-0 BRD 54 (SUTURE)
SYR BULB IRRIGATION 50ML (SYRINGE) ×2 IMPLANT
SYR CONTROL 10ML LL (SYRINGE) ×2 IMPLANT
TAPE CLOTH 4X10 WHT NS (GAUZE/BANDAGES/DRESSINGS) ×2 IMPLANT
TAPE UMBILICAL COTTON 1/8X30 (MISCELLANEOUS) ×2 IMPLANT
TOWEL OR 17X24 6PK STRL BLUE (TOWEL DISPOSABLE) ×2 IMPLANT
TOWEL OR 17X26 10 PK STRL BLUE (TOWEL DISPOSABLE) ×2 IMPLANT
TRAY FOLEY CATH 14FR (SET/KITS/TRAYS/PACK) IMPLANT
WATER STERILE IRR 1000ML POUR (IV SOLUTION) ×2 IMPLANT

## 2012-03-01 NOTE — Brief Op Note (Signed)
03/01/2012  4:40 PM  PATIENT:  Delray Alt  51 y.o. male  PRE-OPERATIVE DIAGNOSIS:  CERVICAL RADICULOPATHY  POST-OPERATIVE DIAGNOSIS:  CERVICAL RADICULOPATHY  PROCEDURE:  Procedure(s) (LRB): ANTERIOR CERVICAL DECOMPRESSION/DISCECTOMY FUSION 2 LEVELS (N/A)  SURGEON:  Surgeon(s) and Role:    * Venita Lick, MD - Primary  PHYSICIAN ASSISTANT:   ASSISTANTS: none   ANESTHESIA:   general  EBL:  Total I/O In: 2000 [I.V.:2000] Out: 100 [Blood:100]  BLOOD ADMINISTERED:none  DRAINS: none   LOCAL MEDICATIONS USED:  MARCAINE     SPECIMEN:  No Specimen  DISPOSITION OF SPECIMEN:  N/A  COUNTS:  YES  TOURNIQUET:  * No tourniquets in log *  DICTATION: .Other Dictation: Dictation Number [id number]  PLAN OF CARE: Admit for overnight observation  PATIENT DISPOSITION:  PACU - hemodynamically stable.

## 2012-03-01 NOTE — Anesthesia Postprocedure Evaluation (Signed)
Anesthesia Post Note  Patient: Peter [name]  Procedure(s) Performed: Procedure(s) (LRB): ANTERIOR CERVICAL DECOMPRESSION/DISCECTOMY FUSION 2 LEVELS (N/A)  Anesthesia type: general  Patient location: PACU  Post pain: Pain level controlled  Post assessment: Patient's Cardiovascular Status Stable  Last Vitals:  Filed Vitals:   03/01/12 1700  BP: 133/79  Pulse: 73  Temp:   Resp: 14    Post vital signs: Reviewed and stable  Level of consciousness: sedated  Complications: No apparent anesthesia complications

## 2012-03-01 NOTE — Anesthesia Procedure Notes (Signed)
Procedure Name: Intubation Date/Time: 03/01/2012 1:27 PM Performed by: Rossie Muskrat L Pre-anesthesia Checklist: Patient identified, Timeout performed, Emergency Drugs available, Suction available and Patient being monitored Patient Re-evaluated:Patient Re-evaluated prior to inductionOxygen Delivery Method: Circle system utilized Preoxygenation: Pre-oxygenation with 100% oxygen Intubation Type: IV induction Ventilation: Mask ventilation without difficulty and Oral airway inserted - appropriate to patient size Laryngoscope Size: Miller and 2 Grade View: Grade I Tube type: Oral Tube size: 7.5 mm Number of attempts: 1 Airway Equipment and Method: Stylet Placement Confirmation: ETT inserted through vocal cords under direct vision,  breath sounds checked- equal and bilateral and positive ETCO2 Secured at: 23 cm Tube secured with: Tape Dental Injury: Teeth and Oropharynx as per pre-operative assessment

## 2012-03-01 NOTE — Transfer of Care (Signed)
Immediate Anesthesia Transfer of Care Note  Patient: Peter [name]  Procedure(s) Performed: Procedure(s) (LRB): ANTERIOR CERVICAL DECOMPRESSION/DISCECTOMY FUSION 2 LEVELS (N/A)  Patient Location: PACU  Anesthesia Type: General  Level of Consciousness: awake, alert  and patient cooperative  Airway & Oxygen Therapy: Patient Spontanous Breathing and Patient connected to face mask oxygen  Post-op Assessment: Report given to PACU RN, Post -op Vital signs reviewed and stable and Patient moving all extremities  Post vital signs: Reviewed and stable  Complications: No apparent anesthesia complications

## 2012-03-01 NOTE — Anesthesia Preprocedure Evaluation (Signed)
Anesthesia Evaluation  Patient identified by MRN, date of birth, ID band Patient awake    Reviewed: Allergy & Precautions, H&P , NPO status , Patient's Chart, lab work & pertinent test results  Airway Mallampati: I TM Distance: >3 FB Neck ROM: Full    Dental   Pulmonary          Cardiovascular hypertension, Pt. on medications     Neuro/Psych    GI/Hepatic   Endo/Other    Renal/GU      Musculoskeletal   Abdominal   Peds  Hematology   Anesthesia Other Findings   Reproductive/Obstetrics                           Anesthesia Physical Anesthesia Plan  ASA: II  Anesthesia Plan: General   Post-op Pain Management:    Induction: Intravenous  Airway Management Planned: Oral ETT  Additional Equipment:   Intra-op Plan:   Post-operative Plan: Extubation in OR  Informed Consent: I have reviewed the patients History and Physical, chart, labs and discussed the procedure including the risks, benefits and alternatives for the proposed anesthesia with the patient or authorized representative who has indicated his/her understanding and acceptance.     Plan Discussed with: CRNA and Surgeon  Anesthesia Plan Comments:         Anesthesia Quick Evaluation  

## 2012-03-01 NOTE — Plan of Care (Signed)
Problem: Consults Goal: Diagnosis - Spinal Surgery Outcome: Completed/Met Date Met:  03/01/12 Cervical Spine Fusion     

## 2012-03-01 NOTE — Preoperative (Signed)
Beta Blockers   Reason not to administer Beta Blockers:Not Applicable 

## 2012-03-01 NOTE — H&P (Signed)
H+P reviewed  No change in clinical exam 

## 2012-03-02 ENCOUNTER — Encounter (HOSPITAL_COMMUNITY): Payer: Self-pay | Admitting: Orthopedic Surgery

## 2012-03-02 MED ORDER — POLYETHYLENE GLYCOL 3350 17 G PO PACK: 17.0000 g | PACK | Freq: Every day | ORAL | Status: AC

## 2012-03-02 MED ORDER — OXYCODONE-ACETAMINOPHEN 10-325 MG PO TABS: 1.0000 | ORAL_TABLET | Freq: Four times a day (QID) | ORAL | Status: AC | PRN

## 2012-03-02 MED ORDER — METHOCARBAMOL 500 MG PO TABS: 500.0000 mg | ORAL_TABLET | Freq: Three times a day (TID) | ORAL | Status: AC

## 2012-03-02 MED ORDER — ONDANSETRON HCL 4 MG PO TABS: 4.0000 mg | ORAL_TABLET | Freq: Three times a day (TID) | ORAL | Status: AC | PRN

## 2012-03-02 NOTE — Progress Notes (Signed)
UR COMPLETED  

## 2012-03-02 NOTE — Discharge Summary (Signed)
Patient ID: Peter [name] MRN: [id number] DOB/AGE: 08/06/1960 51 y.o.  Admit date: 03/01/2012 Discharge date: 03/02/2012  Admission Diagnoses:  Principal Problem:  *Cervical radiculopathy   Discharge Diagnoses:  Principal Problem:  *Cervical radiculopathy  status post Procedure(s): ANTERIOR CERVICAL DECOMPRESSION/DISCECTOMY FUSION 2 LEVELS  Past Medical History  Diagnosis Date  . Hypertension     Surgeries: Procedure(s): ANTERIOR CERVICAL DECOMPRESSION/DISCECTOMY FUSION 2 LEVELS on 03/01/2012   Consultants: none  Discharged Condition: Improved  Hospital Course: Peter [name] is an 51 y.o. male who was admitted 03/01/2012 for operative treatment of Cervical radiculopathy. Patient failed conservative treatments (please see the history and physical for the specifics) and had severe unremitting pain that affects sleep, daily activities and work/hobbies. After pre-op clearance, the patient was taken to the operating room on 03/01/2012 and underwent  Procedure(s): ANTERIOR CERVICAL DECOMPRESSION/DISCECTOMY FUSION 2 LEVELS.    Patient was given perioperative antibiotics: Anti-infectives     Start     Dose/Rate Route Frequency Ordered Stop   03/01/12 2130   ceFAZolin (ANCEF) IVPB 1 g/50 mL premix        1 g 100 mL/hr over 30 Minutes Intravenous Every 8 hours 03/01/12 1837 03/02/12 0539   02/29/12 1033   ceFAZolin (ANCEF) IVPB 2 g/50 mL premix        2 g 100 mL/hr over 30 Minutes Intravenous 60 min pre-op 02/29/12 1033 03/01/12 1328           Patient was given sequential compression devices and early ambulation to prevent DVT.   Patient benefited maximally from hospital stay and there were no complications. At the time of discharge, the patient was urinating/moving their bowels without difficulty, tolerating a regular diet, pain is controlled with oral pain medications and they have been cleared by PT/OT.   Recent vital signs: Patient Vitals for the past 24 hrs:  BP  Temp Temp src Pulse Resp SpO2  03/02/12 0821 136/86 mmHg 98.4 F (36.9 C) - 61  16  95 %  03/02/12 0338 159/93 mmHg 98.8 F (37.1 C) - 93  16  95 %  03/02/12 0003 143/77 mmHg 99.4 F (37.4 C) - 74  16  98 %  03/01/12 2051 142/73 mmHg 99.1 F (37.3 C) - 74  18  98 %  03/01/12 1830 161/87 mmHg 97.6 F (36.4 C) Oral 78  18  97 %  03/01/12 1815 141/83 mmHg 97 F (36.1 C) - 68  16  99 %  03/01/12 1800 138/79 mmHg - - 64  12  97 %  03/01/12 1745 143/81 mmHg - - 66  13  97 %  03/01/12 1730 135/83 mmHg - - 63  14  98 %  03/01/12 1715 139/70 mmHg - - 67  14  91 %  03/01/12 1700 133/79 mmHg - - 73  14  95 %  03/01/12 1655 - 97.6 F (36.4 C) - - - -  03/01/12 1206 123/76 mmHg 98.3 F (36.8 C) Oral 73  18  95 %     Recent laboratory studies: No results found for this basename: WBC:2,HGB:2,HCT:2,PLT:2,NA:2,K:2,CL:2,CO2:2,BUN:2,CREATININE:2,GLUCOSE:2,PT:2,INR:2,CALCIUM,2: in the last 72 hours   Discharge Medications:   Medication List  As of 03/02/2012  9:23 AM   STOP taking these medications         BC HEADACHE PO         TAKE these medications         amitriptyline 100 MG tablet   Commonly known as: ELAVIL  Take 100 mg by mouth at bedtime.      lisinopril-hydrochlorothiazide 20-25 MG per tablet   Commonly known as: PRINZIDE,ZESTORETIC   Take 1 tablet by mouth daily.      methocarbamol 500 MG tablet   Commonly known as: ROBAXIN   Take 1 tablet (500 mg total) by mouth 3 (three) times daily. MAX 3 pills daily      ondansetron 4 MG tablet   Commonly known as: ZOFRAN   Take 1 tablet (4 mg total) by mouth every 8 (eight) hours as needed for nausea. MAX 3 pills daily      oxyCODONE-acetaminophen 10-325 MG per tablet   Commonly known as: PERCOCET   Take 1 tablet by mouth every 6 (six) hours as needed for pain. MAX 4 pills daily      polyethylene glycol packet   Commonly known as: MIRALAX / GLYCOLAX   Take 17 g by mouth daily. Take 1 packet daily until bowels become regular              Diagnostic Studies: Dg Cervical Spine 2-3 Views  03/01/2012  *RADIOLOGY REPORT*  Clinical Data: Status post anterior cervical discectomy and fusion.  CERVICAL SPINE - 2-3 VIEW  Comparison: Intraoperative views same date.  Findings: Patient is status post C5-C7 ACDF.  The hardware position is stable.  The alignment is normal.  No complications are identified.  IMPRESSION: Intact hardware and stable alignment status post C5-C7 ACDF.   Original Report Authenticated By: Gerrianne Scale, M.D.    Dg Cervical Spine 2-3 Views  03/01/2012  *RADIOLOGY REPORT*  Clinical Data: Two level ACDF.  DG C-ARM 61-120 MIN,CERVICAL SPINE - 2-3 VIEW  Comparison: Cervical spine radiographs 02/23/2012.  Findings: Three spot fluoroscopic images of the cervical spine demonstrate interval anterior discectomy and fusion from C5 through C7 utilizing an anterior plate, screws and metallic intervertebral bone plugs.  The hardware appears well positioned.  No complications are identified.  IMPRESSION: Intraoperative views following C5-C7 ACDF.   Original Report Authenticated By: Gerrianne Scale, M.D.    Dg Cervical Spine 2-3 Views  02/23/2012  *RADIOLOGY REPORT*  Clinical Data: Preop anterior cervical decompression.  CERVICAL SPINE - 2-3 VIEW  Comparison: MRI 12/23/2011  Findings: Early degenerative changes at C6-7.  Normal alignment. The remainder of the disc spaces are maintained.  Prevertebral soft tissues are normal.  No fracture.  IMPRESSION: Early spondylosis C6-7.  Original Report Authenticated By: Cyndie Chime, M.D.   Dg C-arm [phone] Min  03/01/2012  *RADIOLOGY REPORT*  Clinical Data: Two level ACDF.  DG C-ARM 61-120 MIN,CERVICAL SPINE - 2-3 VIEW  Comparison: Cervical spine radiographs 02/23/2012.  Findings: Three spot fluoroscopic images of the cervical spine demonstrate interval anterior discectomy and fusion from C5 through C7 utilizing an anterior plate, screws and metallic intervertebral bone plugs.  The  hardware appears well positioned.  No complications are identified.  IMPRESSION: Intraoperative views following C5-C7 ACDF.   Original Report Authenticated By: Gerrianne Scale, M.D.     Discharge Orders    Future Orders Please Complete By Expires   Diet - low sodium heart healthy      Call MD / Call 911      Comments:   If you experience chest pain or shortness of breath, CALL 911 and be transported to the hospital emergency room.  If you develope a fever above 101 F, pus (white drainage) or increased drainage or redness at the wound, or calf pain, call your surgeon's  office.   Constipation Prevention      Comments:   Drink plenty of fluids.  Prune juice may be helpful.  You may use a stool softener, such as Colace (over the counter) 100 mg twice a day.  Use MiraLax (over the counter) for constipation as needed.   Increase activity slowly as tolerated      Discharge instructions      Comments:   Keep incision clean and dry.  Leave steri strips in place.  May shower 5 days from surgery; pat to dry following shower.  May redress with clean, dry dressing if you would like.  Do not apply any lotion/cream/ointment to the incision. Cervical collar may be removed for eating, sleeping and showering.   Driving restrictions      Comments:   No driving for 2 weeks.  Dr Shon Baton will discuss addition driving restrictions at your first post-op visit in 2 weeks.   Lifting restrictions      Comments:   No lifting anything greater than 5 pounds.  DO NOT reach overhead (above shoulder height).  NO bending, stooping or squatting.  Dr. Shon Baton will discuss additional lifting restrictions at your first post-op visit in 2 weeks.      Follow-up Information    Follow up with Alvy Beal, MD in 2 weeks.   Contact information:   [facility] [address] [place] [address] [phone]          Discharge Plan:  discharge to Home   Disposition: stable  at the time of discharge     Signed: Gwinda Maine for Dr. Venita Lick [facility] [phone] 03/02/2012, 9:23 AM

## 2012-03-02 NOTE — Op Note (Signed)
NAMEKAYCEN, WHITWORTH               ACCOUNT NO.:  [id number]  MEDICAL RECORD NO.:  [id number]  LOCATION:  3526                         FACILITY:  [facility]  PHYSICIAN:  Alvy Beal, MD    DATE OF BIRTH:  [birth year]  DATE OF PROCEDURE:  03/01/2012 DATE OF DISCHARGE:                              OPERATIVE REPORT   PREOPERATIVE DIAGNOSIS:  Cervical spondylotic radiculopathy, C5-6, C6-7.  POSTOPERATIVE DIAGNOSIS:  Cervical spondylotic radiculopathy, C5-6, C6- 7.  OPERATIVE PROCEDURE:  Anterior cervical diskectomy and fusion, C5-6, C6- 7.  COMPLICATIONS:  None.  CONDITION:  Stable.  HISTORY:  This is a very pleasant gentleman who is having progressive neck and radicular right arm pain in C6 and greater C7 distribution despite appropriate conservative management consisting of physical therapy, injection therapy, anti-inflammatory medications, activity modification, his overall quality of life continues to deteriorate.  As a result, we elected to proceed with surgery.  All appropriate risks, benefits, and alternatives were discussed with the patient and consent was obtained.  OPERATIVE NOTE:  The patient was brought to the operating room, placed supine on the operating table.  After successful induction of general anesthesia and endotracheal intubation, TEDs and SCDs were applied. Rolled towels were placed between the shoulder blades.  Shoulders themselves were taped down.  The anterior cervical spine was prepped and draped in a standard fashion.  Time-out was done to confirm patient, procedure, and all other pertinent important data.  Once this was done, I identified the C6 vertebral body with x-ray and then made a transverse incision starting at the midline and proceeding to the left.  Sharp dissection was carried out down to the platysma.  Platysma was then sharply incised and I identified the medial border of the sternocleidomastoid.  I then began sharply dissecting the  internervous plane between the omohyoid and the sternocleidomastoid.  I eventually was able to finger palpate down to the anterior cervical spine.  I identified the carotid sheath as well as the esophagus and then swept the esophagus and trachea to the right and protected it with an appendiceal retractor and then protected the carotid sheath with my finger.  At this point, I then used Kittner dissectors to remove the remaining prevertebral fascia to expose the entire anterior cervical spine.  The omohyoid was still crossing the field and causing some difficulty with traction, so I isolated it and transected it.  This provided excellent visualization.  I then placed a needle into the 5-6 disk space, took an intraoperative x- ray with fluoro and then confirmed I was at the appropriate level.  Using bipolar electrocautery, I mobilized the longus colli muscle from the midbody of C5 to the midbody of C7.  I then placed the Caspar retractor blades underneath the longus coli muscles, deflated the endotracheal cuff, expanded the retractor and then reinflated the endotracheal cuff.  I then placed distraction pins into the bodies of C7 and C6 and gently distracted the disk space.  A #15 blade scalpel was used to perform an annulotomy and then I used a combination of pituitary rongeurs, curettes, and Kerrison rongeurs to remove all of the disk material.  I then identified the small fragmented disk  that was posterolateral to the right and using a nerve hooks, I mobilized it and using a micropituitary rongeur, I removed it.  I then used a fine nerve hook to develop a plane underneath the posterior longitudinal ligament and then used a 1 mm Kerrison to resect the posterior longitudinal ligament.  This allowed me to keep 1 mm Kerrison under the uncovertebral joint and resect some of the osteophyte that had formed.  I also resected some of the osteophytes from the posterior aspect of the vertebral  bodies of 6 and 7.  At this point, I rasped the endplates until I had bleeding subchondral bone.  Then using trial devices, I elected to use an eight medium Titan titanium intervertebral cage packed with DBX mix.  This was prepared on the back table and malleted to the appropriate depth.  I had excellent purchase with this implant.  At this point, I removed the distraction pin from the body of C7, placed bone wax in the bleeding bone, and then repositioned it into the body of C5.  I then repositioned my retractors.  So, now I was exposing the 5-6 disk space.  I distracted the disk space and performed an annulotomy.  Using the same technique that I had just used at C6-7, I performed a similar diskectomy at C5-6.  At this time, there was a small left disk fragment, which was also resected using a fine nerve hooks.  Again, this correspond with the preoperative MRI.  I took down the posterior longitudinal ligament in the same fashion as well as the bone spurs.  The endplates were then rasped and at this time, I elected to use a small 8-mm lordotic Titan titanium intervertebral cage, packed with DBX mix.  At this point, distracted pins were removed and x-rays confirmed satisfactory position of both intervertebral cages.  I then obtained a 36-mm anterior cervical Synthes Vectra plate, contoured it and then secured it to the body of C5 with 16- mm self-drilling screws and 14-mm self-drilling screws into the body of C7.  At all times, I protected and visualized the esophagus to ensure that it did not become entrapped beneath the plate.  A 14-mm screws were placed at the body of C6 and all screws were tightened down and locked to the plate.  I then copiously irrigated with normal saline, removed all of the retractors and then checked to make sure I had hemostasis.  Hemostasis was obtained using bipolar electrocautery.  After copious irrigation, I then checked the esophagus again and  confirmed that it was not entrapped beneath the plate.  I then returned the trachea and esophagus to midline, closed the platysma with interrupted 2-0 Vicryl suture.  I then closed the skin with 3-0 Monocryl.  Steri-Strips and dry dressing were applied, Aspen collar, and the patient was extubated and transferred to the PACU without incident.  At the end of the case, all needle and sponge counts were correct.     Alvy Beal, MD     DDB/MEDQ  D:  03/01/2012  T:  03/02/2012  Job:  [id number]

## 2012-03-02 NOTE — Progress Notes (Signed)
Pt and wife given D/C instructions with Rx's, both verbalized understanding. Pt D/C'd home via wheelchair per MD order. Mayuri Staples, RN 

## 2012-03-02 NOTE — Progress Notes (Signed)
    Subjective: Procedure(s) (LRB): ANTERIOR CERVICAL DECOMPRESSION/DISCECTOMY FUSION 2 LEVELS (N/A) 1 Day Post-Op  Patient reports pain as 2 on 0-10 scale.  Reports decreased arm pain denies incisional neck pain   Positive void Negative bowel movement Positive flatus Negative chest pain or shortness of breath  Objective: Vital signs in last 24 hours: Temp:  [97 F (36.1 C)-99.4 F (37.4 C)] 98.4 F (36.9 C) (08/22 0821) Pulse Rate:  [61-93] 61  (08/22 0821) Resp:  [12-18] 16  (08/22 0821) BP: (123-161)/(70-93) 136/86 mmHg (08/22 0821) SpO2:  [91 %-99 %] 95 % (08/22 0821)  Intake/Output from previous day: 08/21 0701 - 08/22 0700 In: 2595 [P.O.:340; I.V.:2200; IV Piggyback:55] Out: 100 [Blood:100]  Labs: No results found for this basename: WBC:2,RBC:2,HCT:2,PLT:2 in the last 72 hours No results found for this basename: NA:2,K:2,CL:2,CO2:2,BUN:2,CREATININE:2,GLUCOSE:2,CALCIUM:2 in the last 72 hours No results found for this basename: LABPT:2,INR:2 in the last 72 hours  Physical Exam: Neurologically intact ABD soft Neurovascular intact Sensation intact distally Incision: dressing C/D/I  Assessment/Plan: Patient stable  xrays satisfactory Mobilization with physical therapy Encourage incentive spirometry Continue care  Discharge home with home health  Venita Lick, MD [facility] [phone]

## 2012-03-07 ENCOUNTER — Encounter (HOSPITAL_COMMUNITY): Payer: Self-pay

## 2013-10-11 ENCOUNTER — Encounter (HOSPITAL_COMMUNITY): Payer: Self-pay | Admitting: Emergency Medicine

## 2013-10-11 ENCOUNTER — Emergency Department (HOSPITAL_COMMUNITY): Payer: No Typology Code available for payment source

## 2013-10-11 DIAGNOSIS — Y9389 Activity, other specified: Secondary | ICD-10-CM | POA: Diagnosis not present

## 2013-10-11 DIAGNOSIS — F17200 Nicotine dependence, unspecified, uncomplicated: Secondary | ICD-10-CM | POA: Insufficient documentation

## 2013-10-11 DIAGNOSIS — IMO0002 Reserved for concepts with insufficient information to code with codable children: Secondary | ICD-10-CM | POA: Diagnosis not present

## 2013-10-11 DIAGNOSIS — S199XXA Unspecified injury of neck, initial encounter: Principal | ICD-10-CM

## 2013-10-11 DIAGNOSIS — I10 Essential (primary) hypertension: Secondary | ICD-10-CM | POA: Insufficient documentation

## 2013-10-11 DIAGNOSIS — Y92410 Unspecified street and highway as the place of occurrence of the external cause: Secondary | ICD-10-CM | POA: Insufficient documentation

## 2013-10-11 DIAGNOSIS — S0993XA Unspecified injury of face, initial encounter: Secondary | ICD-10-CM | POA: Diagnosis present
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
## 2013-10-12 ENCOUNTER — Emergency Department (HOSPITAL_COMMUNITY)
Admission: EM | Admit: 2013-10-12 | Discharge: 2013-10-12 | Discharge status: Against Medical Advice | Payer: No Typology Code available for payment source | Attending: Emergency Medicine | Admitting: Emergency Medicine

## 2013-10-12 ENCOUNTER — Encounter (HOSPITAL_COMMUNITY): Payer: Self-pay | Admitting: Emergency Medicine

## 2013-10-12 ENCOUNTER — Emergency Department (HOSPITAL_COMMUNITY)
Admission: EM | Admit: 2013-10-12 | Discharge: 2013-10-12 | Disposition: A | Discharge status: Home / Self Care | Payer: No Typology Code available for payment source | Attending: Emergency Medicine | Admitting: Emergency Medicine

## 2013-10-12 DIAGNOSIS — Z79899 Other long term (current) drug therapy: Secondary | ICD-10-CM | POA: Insufficient documentation

## 2013-10-12 DIAGNOSIS — Y9389 Activity, other specified: Secondary | ICD-10-CM | POA: Insufficient documentation

## 2013-10-12 DIAGNOSIS — Y92410 Unspecified street and highway as the place of occurrence of the external cause: Secondary | ICD-10-CM | POA: Insufficient documentation

## 2013-10-12 DIAGNOSIS — IMO0002 Reserved for concepts with insufficient information to code with codable children: Secondary | ICD-10-CM | POA: Insufficient documentation

## 2013-10-12 DIAGNOSIS — F17200 Nicotine dependence, unspecified, uncomplicated: Secondary | ICD-10-CM | POA: Insufficient documentation

## 2013-10-12 DIAGNOSIS — Z981 Arthrodesis status: Secondary | ICD-10-CM | POA: Insufficient documentation

## 2013-10-12 DIAGNOSIS — M5412 Radiculopathy, cervical region: Secondary | ICD-10-CM | POA: Insufficient documentation

## 2013-10-12 DIAGNOSIS — M5416 Radiculopathy, lumbar region: Secondary | ICD-10-CM

## 2013-10-12 DIAGNOSIS — I10 Essential (primary) hypertension: Secondary | ICD-10-CM | POA: Insufficient documentation

## 2013-10-12 MED ORDER — OXYCODONE-ACETAMINOPHEN 5-325 MG PO TABS: 1.0000 | ORAL_TABLET | Freq: Four times a day (QID) | ORAL | Status: DC | PRN

## 2013-10-12 MED ORDER — PREDNISONE 20 MG PO TABS: ORAL_TABLET | ORAL | Status: DC

## 2013-10-12 MED ORDER — METHOCARBAMOL 500 MG PO TABS: 1000.0000 mg | ORAL_TABLET | Freq: Four times a day (QID) | ORAL | Status: DC

## 2013-10-12 MED ORDER — NAPROXEN 500 MG PO TABS: 500.0000 mg | ORAL_TABLET | Freq: Two times a day (BID) | ORAL | Status: DC
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
## 2013-10-12 NOTE — ED Provider Notes (Signed)
Medical screening examination/treatment/procedure(s) were performed by non-physician practitioner and as supervising physician I was immediately available for consultation/collaboration.  Paiton Boultinghouse L Kelbi Renstrom, MD 10/12/13 1501 
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
## 2013-10-12 NOTE — ED Notes (Signed)
[place], PA at bedside for evaluation.  

## 2013-10-12 NOTE — ED Notes (Addendum)
Patient family member up to desk explaining that they cannot wait any longer. Family explained that they will be back after getting some sleep. Encouraged patient to return if symptoms persist or worsen. Patient and family seen exiting department.

## 2013-10-16 ENCOUNTER — Other Ambulatory Visit: Payer: Self-pay | Admitting: Family Medicine

## 2013-10-16 DIAGNOSIS — M545 Low back pain, unspecified: Secondary | ICD-10-CM

## 2013-10-16 DIAGNOSIS — M542 Cervicalgia: Secondary | ICD-10-CM

## 2013-10-20 ENCOUNTER — Ambulatory Visit
Admission: RE | Admit: 2013-10-20 | Discharge: 2013-10-20 | Disposition: A | Discharge status: Home / Self Care | Payer: Medicare Other | Source: Ambulatory Visit | Attending: Family Medicine | Admitting: Family Medicine

## 2013-10-20 DIAGNOSIS — M545 Low back pain, unspecified: Secondary | ICD-10-CM

## 2013-10-20 DIAGNOSIS — M542 Cervicalgia: Secondary | ICD-10-CM

## 2013-11-06 ENCOUNTER — Other Ambulatory Visit: Payer: Self-pay | Admitting: Orthopedic Surgery

## 2013-11-14 MED ORDER — CEFAZOLIN SODIUM-DEXTROSE 2-3 GM-% IV SOLR: 2.0000 g | INTRAVENOUS | Status: AC

## 2013-11-14 MED ORDER — POVIDONE-IODINE 7.5 % EX SOLN
Freq: Once | CUTANEOUS | Status: DC
  Filled 2013-11-14: qty 118

## 2013-11-14 MED ORDER — MUPIROCIN 2 % EX OINT
TOPICAL_OINTMENT | Freq: Two times a day (BID) | CUTANEOUS | Status: DC
  Filled 2013-11-14: qty 22

## 2013-11-14 NOTE — H&P (Signed)
PREOPERATIVE H&P  Chief Complaint: BILATERAL LEG PAIN  HPI: Peter [name] is a 53 y.o. male who presents with ongoing bilateral leg pain in addition to low back pain. Patient has failed conservative care and continues to have pain. 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   Past Medical History  Diagnosis Date  . Hypertension    Past Surgical History  Procedure Laterality Date  . Wrist surgery    . Back fusion    . Joint replacement      lt knee   acl  . Anterior cervical decomp/discectomy fusion  03/01/2012    Procedure: ANTERIOR CERVICAL DECOMPRESSION/DISCECTOMY FUSION 2 LEVELS;  Surgeon: Venita Lickahari Brooks, MD;  Location: [facility] OR;  Service: Orthopedics;  Laterality: N/A;  ACDF C5-7   History   Social History  . Marital Status: Married    Spouse Name: N/A    Number of Children: N/A  . Years of Education: N/A   Social History Main Topics  . Smoking status: Current Every Day Smoker -- 1.00 packs/day for 30 years    Types: Cigarettes  . Smokeless tobacco: Not on file  . Alcohol Use: Yes     Comment: occ  . Drug Use: No  . Sexual Activity:    Other Topics Concern  . Not on file   Social History Narrative  . No narrative on file   No family history on file. No Known Allergies Prior to Admission medications   Medication Sig Start Date End Date Taking? Authorizing Provider  amitriptyline (ELAVIL) 100 MG tablet Take 100 mg by mouth at bedtime.    Historical Provider, MD  lisinopril-hydrochlorothiazide (PRINZIDE,ZESTORETIC) 20-25 MG per tablet Take 1 tablet by mouth daily.    Historical Provider, MD  methocarbamol (ROBAXIN) 500 MG tablet Take 2 tablets (1,000 mg total) by mouth 4 (four) times daily. 10/12/13   Renne CriglerJoshua Geiple, PA-C  naproxen (NAPROSYN) 500 MG tablet Take 1 tablet (500 mg total) by mouth 2 (two) times daily. 10/12/13   Renne CriglerJoshua Geiple, PA-C  oxyCODONE-acetaminophen  (PERCOCET/ROXICET) 5-325 MG per tablet Take 1-2 tablets by mouth every 6 (six) hours as needed for severe pain. 10/12/13   Renne CriglerJoshua Geiple, PA-C  predniSONE (DELTASONE) 20 MG tablet 3 Tabs PO Days 1-3, then 2 tabs PO Days 4-6, then 1 tab PO Day 7-9, then Half Tab PO Day 10-12 10/12/13   Renne CriglerJoshua Geiple, PA-C     All other systems have been reviewed and were otherwise negative with the exception of those mentioned in the HPI and as above.  Physical Exam: There were no vitals filed for this visit.  General: Alert, no acute distress Cardiovascular: No pedal edema Respiratory: No cyanosis, no use of accessory musculature GI: No organomegaly, abdomen is soft and non-tender Skin: No lesions in the area of chief complaint Neurologic: Sensation intact distally Psychiatric: Patient is competent for consent with normal mood and affect Lymphatic: No axillary or cervical lymphadenopathy  MUSCULOSKELETAL: + TTP low back  Assessment/Plan: Bilateral leg pain Plan for Procedure(s): LATERAL INTERBODY WITH INSTRUMENTATION L 3-4   Emilee HeroMark Leonard Valdemar Mcclenahan, MD 11/14/2013 4:23 PM

## 2013-11-14 NOTE — Progress Notes (Signed)
Tried numerous times today to get in touch with patient for pre-op call. Finally got hold of a person at the home # listed (?wife) and she gave me his father's # to reach pt at. He was not at that number and his father wasn't sure when he would be back. I gave his father the time pt needs to be here in the AM, to be NPO after MN, what meds he can take in the AM and where to come in the morning.

## 2013-11-15 ENCOUNTER — Inpatient Hospital Stay (HOSPITAL_COMMUNITY): Admission: RE | Admit: 2013-11-15 | Payer: Medicare Other | Source: Ambulatory Visit | Admitting: Orthopedic Surgery

## 2013-11-15 SURGERY — ANTERIOR LATERAL LUMBAR FUSION 1 LEVEL
Anesthesia: General | Laterality: Left

## 2013-12-05 ENCOUNTER — Other Ambulatory Visit: Payer: Self-pay | Admitting: Orthopedic Surgery

## 2013-12-12 NOTE — Pre-Procedure Instructions (Signed)
Delray AltBobby E [name]  12/12/2013   Your procedure is scheduled on:  12/20/13  [facility] short [facility] at 1030 AM.  Call this number if you have problems the morning of surgery: [phone]   Remember:   Do not eat food or drink liquids after midnight.   Take these medicines the morning of surgery with A SIP OF WATER:       Take all meds until day of surgery as ordered except as instructed below or per dr     Despina AriasSTOP all herbel meds, nsaids (aleve,naproxen,advil,ibuprofen) 5 days prior to surgery (6.6.15) including vitamins ,aspirin   Do not wear jewelry, .  Do not wear lotions, powders, or perfumes. You may wear deodorant.  Do not shave 48 hours prior to surgery. Men may shave face and neck.  Do not bring valuables to the hospital.  [facility] is not responsible                  for any belongings or valuables.               Contacts, dentures or bridgework may not be worn into surgery.  Leave suitcase in the car. After surgery it may be brought to your room.  For patients admitted to the hospital, discharge time is determined by your                treatment team.               Patients discharged the day of surgery will not be allowed to drive  home.  Name and phone number of your driver:   Special Instructions:  Special Instructions: [facility] - Preparing for Surgery  Before surgery, you can play an important role.  Because skin is not sterile, your skin needs to be as free of germs as possible.  You can reduce the number of germs on you skin by washing with CHG (chlorahexidine gluconate) soap before surgery.  CHG is an antiseptic cleaner which kills germs and bonds with the skin to continue killing germs even after washing.  Please DO NOT use if you have an allergy to CHG or antibacterial soaps.  If your skin becomes reddened/irritated stop using the CHG and inform your nurse when you arrive at Short Stay.  Do not shave (including legs and underarms) for at least 48  hours prior to the first CHG shower.  You may shave your face.  Please follow these instructions carefully:   1.  Shower with CHG Soap the night before surgery and the morning of Surgery.  2.  If you choose to wash your hair, wash your hair first as usual with your normal shampoo.  3.  After you shampoo, rinse your hair and body thoroughly to remove the Shampoo.  4.  Use CHG as you would any other liquid soap.  You can apply chg directly  to the skin and wash gently with scrungie or a clean washcloth.  5.  Apply the CHG Soap to your body ONLY FROM THE NECK DOWN.  Do not use on open wounds or open sores.  Avoid contact with your eyes ears, mouth and genitals (private parts).  Wash genitals (private parts)       with your normal soap.  6.  Wash thoroughly, paying special attention to the area where your surgery will be performed.  7.  Thoroughly rinse your body with warm water from the neck down.  8.  DO  NOT shower/wash with your normal soap after using and rinsing off the CHG Soap.  9.  Pat yourself dry with a clean towel.            10.  Wear clean pajamas.            11.  Place clean sheets on your bed the night of your first shower and do not sleep with pets.  Day of Surgery  Do not apply any lotions/deodorants the morning of surgery.  Please wear clean clothes to the hospital/surgery center.   Please read over the following fact sheets that you were given: Pain Booklet, Coughing and Deep Breathing, Blood Transfusion Information, MRSA Information and Surgical Site Infection Prevention

## 2013-12-13 ENCOUNTER — Encounter (HOSPITAL_COMMUNITY): Payer: Self-pay

## 2013-12-13 ENCOUNTER — Encounter (HOSPITAL_COMMUNITY)
Admission: RE | Admit: 2013-12-13 | Discharge: 2013-12-13 | Disposition: A | Discharge status: Home / Self Care | Payer: Medicare Other | Source: Ambulatory Visit | Attending: Orthopedic Surgery | Admitting: Orthopedic Surgery

## 2013-12-13 DIAGNOSIS — Z01812 Encounter for preprocedural laboratory examination: Secondary | ICD-10-CM | POA: Insufficient documentation

## 2013-12-13 DIAGNOSIS — Z01810 Encounter for preprocedural cardiovascular examination: Secondary | ICD-10-CM | POA: Insufficient documentation

## 2013-12-13 DIAGNOSIS — Z01818 Encounter for other preprocedural examination: Secondary | ICD-10-CM | POA: Insufficient documentation

## 2013-12-13 HISTORY — DX: Unspecified osteoarthritis, unspecified site: M19.90

## 2013-12-13 LAB — CBC WITH DIFFERENTIAL/PLATELET
Basophils Absolute: 0 10*3/uL (ref 0.0–0.1)
Basophils Relative: 0 % (ref 0–1)
Eosinophils Absolute: 0.3 10*3/uL (ref 0.0–0.7)
Eosinophils Relative: 3 % (ref 0–5)
HCT: 51.9 % (ref 39.0–52.0)
Hemoglobin: 17.5 g/dL — ABNORMAL HIGH (ref 13.0–17.0)
Lymphocytes Relative: 26 % (ref 12–46)
Lymphs Abs: 2.3 10*3/uL (ref 0.7–4.0)
MCH: 33.4 pg (ref 26.0–34.0)
MCHC: 33.7 g/dL (ref 30.0–36.0)
MCV: 99 fL (ref 78.0–100.0)
Monocytes Absolute: 0.7 10*3/uL (ref 0.1–1.0)
Monocytes Relative: 8 % (ref 3–12)
Neutro Abs: 5.7 10*3/uL (ref 1.7–7.7)
Neutrophils Relative %: 63 % (ref 43–77)
Platelets: 184 10*3/uL (ref 150–400)
RBC: 5.24 MIL/uL (ref 4.22–5.81)
RDW: 14.4 % (ref 11.5–15.5)
WBC: 9 10*3/uL (ref 4.0–10.5)

## 2013-12-13 LAB — URINALYSIS, ROUTINE W REFLEX MICROSCOPIC
Bilirubin Urine: NEGATIVE
Glucose, UA: NEGATIVE mg/dL
Ketones, ur: NEGATIVE mg/dL
Nitrite: NEGATIVE
Protein, ur: NEGATIVE mg/dL
Specific Gravity, Urine: 1.01 (ref 1.005–1.030)
Urobilinogen, UA: 0.2 mg/dL (ref 0.0–1.0)
pH: 7 (ref 5.0–8.0)

## 2013-12-13 LAB — TYPE AND SCREEN
ABO/RH(D): O POS
Antibody Screen: NEGATIVE

## 2013-12-13 LAB — COMPREHENSIVE METABOLIC PANEL
ALT: 18 U/L (ref 0–53)
AST: 17 U/L (ref 0–37)
Albumin: 4 g/dL (ref 3.5–5.2)
Alkaline Phosphatase: 63 U/L (ref 39–117)
BUN: 10 mg/dL (ref 6–23)
CO2: 27 mEq/L (ref 19–32)
Calcium: 9.7 mg/dL (ref 8.4–10.5)
Chloride: 100 mEq/L (ref 96–112)
Creatinine, Ser: 0.93 mg/dL (ref 0.50–1.35)
GFR calc Af Amer: >90 mL/min (ref >90)
GFR calc non Af Amer: >90 mL/min (ref >90)
Glucose, Bld: 99 mg/dL (ref 70–99)
Potassium: 3.9 mEq/L (ref 3.7–5.3)
Sodium: 140 mEq/L (ref 137–147)
Total Bilirubin: 0.6 mg/dL (ref 0.3–1.2)
Total Protein: 6.9 g/dL (ref 6.0–8.3)

## 2013-12-13 LAB — URINE MICROSCOPIC-ADD ON

## 2013-12-13 LAB — SURGICAL PCR SCREEN
MRSA, PCR: NEGATIVE
Staphylococcus aureus: NEGATIVE

## 2013-12-13 LAB — PROTIME-INR
INR: 0.85 (ref 0.00–1.49)
Prothrombin Time: 11.5 seconds — ABNORMAL LOW (ref 11.6–15.2)

## 2013-12-13 LAB — APTT: aPTT: 25 seconds (ref 24–37)

## 2013-12-13 NOTE — Progress Notes (Signed)
Denies any cardiac problem.   DA

## 2013-12-13 NOTE — Pre-Procedure Instructions (Signed)
JAKIN MOQUETE  12/13/2013   Your procedure is scheduled on:  Thursday, June 11th.   Report to [facility] Admitting at 10:30 AM.   Call this number if you have problems the morning of surgery: [phone]   Remember:   Do not eat food or drink liquids after midnight Wednesday.    Take these medicines the morning of surgery with A SIP OF WATER:  Morphine   Do not wear jewelry, no rings or watches.  Do not wear lotions or colognes.  You may NOT wear deodorant.   Men may shave face and neck.   Do not bring valuables to the hospital.  [facility] is not responsible for any belongings or valuables.               Contacts, dentures or bridgework may not be worn into surgery.  Leave suitcase in the car. After surgery it may be brought to your room.  For patients admitted to the hospital, discharge time is determined by your treatment team.             Name and phone number of your driver:  Finnie Fabbri - spouse     Special Instructions: "Preparing for Surgery" instruction sheet   Please read over the following fact sheets that you were given: Pain Booklet, Blood Transfusion Information, MRSA Information and Surgical Site Infection Prevention

## 2013-12-19 MED ORDER — CEFAZOLIN SODIUM-DEXTROSE 2-3 GM-% IV SOLR
2.0000 g | INTRAVENOUS | Status: AC
  Administered 2013-12-20: 2 g via INTRAVENOUS
  Filled 2013-12-19: qty 50

## 2013-12-19 MED ORDER — POVIDONE-IODINE 7.5 % EX SOLN
Freq: Once | CUTANEOUS | Status: DC
  Filled 2013-12-19: qty 118

## 2013-12-19 NOTE — Progress Notes (Signed)
Spoke with wife and instructed them to arrive at 0930 with stated understanding.   Spoke with wife kim and told of another time change and asked to be here at 845.

## 2013-12-19 NOTE — Progress Notes (Signed)
Spoke with wife and instructed them to arrive at 0930 with stated understanding.

## 2013-12-20 ENCOUNTER — Inpatient Hospital Stay (HOSPITAL_COMMUNITY): Payer: Medicare Other | Admitting: Anesthesiology

## 2013-12-20 ENCOUNTER — Encounter (HOSPITAL_COMMUNITY): Payer: Medicare Other | Admitting: Anesthesiology

## 2013-12-20 ENCOUNTER — Inpatient Hospital Stay (HOSPITAL_COMMUNITY)
Admission: RE | Admit: 2013-12-20 | Discharge: 2013-12-22 | DRG: 460 | Disposition: A | Discharge status: Home / Self Care | Payer: Medicare Other | Source: Ambulatory Visit | Attending: Orthopedic Surgery | Admitting: Orthopedic Surgery

## 2013-12-20 ENCOUNTER — Inpatient Hospital Stay (HOSPITAL_COMMUNITY): Payer: Medicare Other

## 2013-12-20 ENCOUNTER — Encounter (HOSPITAL_COMMUNITY)
Admission: RE | Disposition: A | Discharge status: Home / Self Care | Payer: Medicare Other | Source: Ambulatory Visit | Attending: Orthopedic Surgery

## 2013-12-20 DIAGNOSIS — Z96659 Presence of unspecified artificial knee joint: Secondary | ICD-10-CM

## 2013-12-20 DIAGNOSIS — Z79899 Other long term (current) drug therapy: Secondary | ICD-10-CM

## 2013-12-20 DIAGNOSIS — F17200 Nicotine dependence, unspecified, uncomplicated: Secondary | ICD-10-CM | POA: Diagnosis present

## 2013-12-20 DIAGNOSIS — M79609 Pain in unspecified limb: Secondary | ICD-10-CM | POA: Diagnosis present

## 2013-12-20 DIAGNOSIS — I10 Essential (primary) hypertension: Secondary | ICD-10-CM | POA: Diagnosis present

## 2013-12-20 DIAGNOSIS — M5137 Other intervertebral disc degeneration, lumbosacral region: Secondary | ICD-10-CM | POA: Diagnosis present

## 2013-12-20 DIAGNOSIS — M51379 Other intervertebral disc degeneration, lumbosacral region without mention of lumbar back pain or lower extremity pain: Principal | ICD-10-CM | POA: Diagnosis present

## 2013-12-20 DIAGNOSIS — M5410 Radiculopathy, site unspecified: Secondary | ICD-10-CM

## 2013-12-20 HISTORY — PX: ANTERIOR LAT LUMBAR FUSION: SHX1168

## 2013-12-20 HISTORY — DX: Radiculopathy, site unspecified: M54.10

## 2013-12-20 HISTORY — PX: OTHER SURGICAL HISTORY: SHX169

## 2013-12-20 SURGERY — ANTERIOR LATERAL LUMBAR FUSION 1 LEVEL
Anesthesia: General | Laterality: Left

## 2013-12-20 MED ORDER — ONDANSETRON HCL 4 MG/2ML IJ SOLN
INTRAMUSCULAR | Status: DC | PRN
  Administered 2013-12-20: 4 mg via INTRAVENOUS

## 2013-12-20 MED ORDER — LIDOCAINE HCL (CARDIAC) 20 MG/ML IV SOLN
INTRAVENOUS | Status: AC
  Filled 2013-12-20: qty 5

## 2013-12-20 MED ORDER — FENTANYL CITRATE 0.05 MG/ML IJ SOLN
INTRAMUSCULAR | Status: AC
  Filled 2013-12-20: qty 5

## 2013-12-20 MED ORDER — ALUM & MAG HYDROXIDE-SIMETH 200-200-20 MG/5ML PO SUSP: 30.0000 mL | Freq: Four times a day (QID) | ORAL | Status: DC | PRN

## 2013-12-20 MED ORDER — MORPHINE SULFATE (PF) 1 MG/ML IV SOLN
INTRAVENOUS | Status: DC
  Administered 2013-12-20 (×2): 1.5 mg via INTRAVENOUS
  Administered 2013-12-20: 18 mg via INTRAVENOUS
  Administered 2013-12-21 (×2): 9 mg via INTRAVENOUS
  Filled 2013-12-20: qty 25

## 2013-12-20 MED ORDER — DIAZEPAM 5 MG PO TABS
5.0000 mg | ORAL_TABLET | Freq: Four times a day (QID) | ORAL | Status: DC | PRN
  Administered 2013-12-21 – 2013-12-22 (×3): 5 mg via ORAL
  Filled 2013-12-20 (×3): qty 1

## 2013-12-20 MED ORDER — MORPHINE SULFATE 15 MG PO TABS
15.0000 mg | ORAL_TABLET | Freq: Three times a day (TID) | ORAL | Status: DC | PRN
  Administered 2013-12-21 – 2013-12-22 (×4): 15 mg via ORAL
  Filled 2013-12-20 (×4): qty 1

## 2013-12-20 MED ORDER — BISACODYL 5 MG PO TBEC: 5.0000 mg | DELAYED_RELEASE_TABLET | Freq: Every day | ORAL | Status: DC | PRN

## 2013-12-20 MED ORDER — BUPIVACAINE-EPINEPHRINE 0.25% -1:200000 IJ SOLN
INTRAMUSCULAR | Status: DC | PRN
  Administered 2013-12-20: 5 mL

## 2013-12-20 MED ORDER — HYDROCHLOROTHIAZIDE 25 MG PO TABS
25.0000 mg | ORAL_TABLET | Freq: Every day | ORAL | Status: DC
  Administered 2013-12-20 – 2013-12-22 (×3): 25 mg via ORAL
  Filled 2013-12-20 (×3): qty 1

## 2013-12-20 MED ORDER — CEFAZOLIN SODIUM-DEXTROSE 2-3 GM-% IV SOLR
INTRAVENOUS | Status: DC | PRN
  Administered 2013-12-20: 2 g via INTRAVENOUS

## 2013-12-20 MED ORDER — ACETAMINOPHEN 325 MG PO TABS: 650.0000 mg | ORAL_TABLET | ORAL | Status: DC | PRN

## 2013-12-20 MED ORDER — BUPIVACAINE-EPINEPHRINE (PF) 0.25% -1:200000 IJ SOLN
INTRAMUSCULAR | Status: AC
  Filled 2013-12-20: qty 30

## 2013-12-20 MED ORDER — LISINOPRIL-HYDROCHLOROTHIAZIDE 20-25 MG PO TABS: 1.0000 | ORAL_TABLET | Freq: Every day | ORAL | Status: DC

## 2013-12-20 MED ORDER — LISINOPRIL 20 MG PO TABS
20.0000 mg | ORAL_TABLET | Freq: Every day | ORAL | Status: DC
  Administered 2013-12-20 – 2013-12-22 (×3): 20 mg via ORAL
  Filled 2013-12-20 (×3): qty 1

## 2013-12-20 MED ORDER — ONDANSETRON HCL 4 MG/2ML IJ SOLN: 4.0000 mg | Freq: Four times a day (QID) | INTRAMUSCULAR | Status: DC | PRN

## 2013-12-20 MED ORDER — AMITRIPTYLINE HCL 100 MG PO TABS
100.0000 mg | ORAL_TABLET | Freq: Every day | ORAL | Status: DC
  Administered 2013-12-20 – 2013-12-21 (×2): 100 mg via ORAL
  Filled 2013-12-20 (×3): qty 1

## 2013-12-20 MED ORDER — ONDANSETRON HCL 4 MG/2ML IJ SOLN: 4.0000 mg | INTRAMUSCULAR | Status: DC | PRN

## 2013-12-20 MED ORDER — FLEET ENEMA 7-19 GM/118ML RE ENEM: 1.0000 | ENEMA | Freq: Once | RECTAL | Status: AC | PRN

## 2013-12-20 MED ORDER — HYDROMORPHONE HCL PF 1 MG/ML IJ SOLN
0.2500 mg | INTRAMUSCULAR | Status: DC | PRN
  Administered 2013-12-20 (×4): 0.5 mg via INTRAVENOUS

## 2013-12-20 MED ORDER — SODIUM CHLORIDE 0.9 % IV SOLN: 250.0000 mL | INTRAVENOUS | Status: DC

## 2013-12-20 MED ORDER — PROPOFOL 10 MG/ML IV BOLUS
INTRAVENOUS | Status: AC
  Filled 2013-12-20: qty 20

## 2013-12-20 MED ORDER — PHENOL 1.4 % MT LIQD: 1.0000 | OROMUCOSAL | Status: DC | PRN

## 2013-12-20 MED ORDER — LACTATED RINGERS IV SOLN
INTRAVENOUS | Status: DC
  Administered 2013-12-20: 10:00:00 via INTRAVENOUS

## 2013-12-20 MED ORDER — MIDAZOLAM HCL 2 MG/2ML IJ SOLN
INTRAMUSCULAR | Status: AC
  Filled 2013-12-20: qty 2

## 2013-12-20 MED ORDER — MORPHINE SULFATE 2 MG/ML IJ SOLN
1.0000 mg | INTRAMUSCULAR | Status: DC | PRN
  Administered 2013-12-21 – 2013-12-22 (×6): 2 mg via INTRAVENOUS
  Filled 2013-12-20 (×6): qty 1

## 2013-12-20 MED ORDER — DIPHENHYDRAMINE HCL 50 MG/ML IJ SOLN: 12.5000 mg | Freq: Four times a day (QID) | INTRAMUSCULAR | Status: DC | PRN

## 2013-12-20 MED ORDER — THROMBIN 20000 UNITS EX SOLR
CUTANEOUS | Status: AC
  Filled 2013-12-20: qty 20000

## 2013-12-20 MED ORDER — DIPHENHYDRAMINE HCL 12.5 MG/5ML PO ELIX: 12.5000 mg | ORAL_SOLUTION | Freq: Four times a day (QID) | ORAL | Status: DC | PRN

## 2013-12-20 MED ORDER — PROMETHAZINE HCL 25 MG/ML IJ SOLN: 6.2500 mg | INTRAMUSCULAR | Status: DC | PRN

## 2013-12-20 MED ORDER — DOCUSATE SODIUM 100 MG PO CAPS
100.0000 mg | ORAL_CAPSULE | Freq: Two times a day (BID) | ORAL | Status: DC
  Administered 2013-12-20 – 2013-12-22 (×4): 100 mg via ORAL
  Filled 2013-12-20 (×5): qty 1

## 2013-12-20 MED ORDER — SODIUM CHLORIDE 0.9 % IJ SOLN
3.0000 mL | Freq: Two times a day (BID) | INTRAMUSCULAR | Status: DC
  Administered 2013-12-21 (×2): 3 mL via INTRAVENOUS

## 2013-12-20 MED ORDER — ACETAMINOPHEN 650 MG RE SUPP: 650.0000 mg | RECTAL | Status: DC | PRN

## 2013-12-20 MED ORDER — MENTHOL 3 MG MT LOZG: 1.0000 | LOZENGE | OROMUCOSAL | Status: DC | PRN

## 2013-12-20 MED ORDER — MORPHINE SULFATE (PF) 1 MG/ML IV SOLN
INTRAVENOUS | Status: AC
  Filled 2013-12-20: qty 25

## 2013-12-20 MED ORDER — SODIUM CHLORIDE 0.9 % IJ SOLN: 9.0000 mL | INTRAMUSCULAR | Status: DC | PRN

## 2013-12-20 MED ORDER — SODIUM CHLORIDE 0.9 % IV SOLN: INTRAVENOUS | Status: DC

## 2013-12-20 MED ORDER — ROCURONIUM BROMIDE 50 MG/5ML IV SOLN
INTRAVENOUS | Status: AC
  Filled 2013-12-20: qty 1

## 2013-12-20 MED ORDER — SUCCINYLCHOLINE CHLORIDE 20 MG/ML IJ SOLN
INTRAMUSCULAR | Status: DC | PRN
  Administered 2013-12-20: 120 mg via INTRAVENOUS

## 2013-12-20 MED ORDER — LACTATED RINGERS IV SOLN
INTRAVENOUS | Status: DC | PRN
  Administered 2013-12-20 (×2): via INTRAVENOUS

## 2013-12-20 MED ORDER — HYDROMORPHONE HCL PF 1 MG/ML IJ SOLN
INTRAMUSCULAR | Status: AC
  Filled 2013-12-20: qty 1

## 2013-12-20 MED ORDER — SENNOSIDES-DOCUSATE SODIUM 8.6-50 MG PO TABS: 1.0000 | ORAL_TABLET | Freq: Every evening | ORAL | Status: DC | PRN

## 2013-12-20 MED ORDER — SODIUM CHLORIDE 0.9 % IJ SOLN: 3.0000 mL | INTRAMUSCULAR | Status: DC | PRN

## 2013-12-20 MED ORDER — CEFAZOLIN SODIUM 1-5 GM-% IV SOLN
1.0000 g | Freq: Three times a day (TID) | INTRAVENOUS | Status: AC
  Administered 2013-12-20 – 2013-12-21 (×2): 1 g via INTRAVENOUS
  Filled 2013-12-20 (×2): qty 50

## 2013-12-20 MED ORDER — NALOXONE HCL 0.4 MG/ML IJ SOLN: 0.4000 mg | INTRAMUSCULAR | Status: DC | PRN

## 2013-12-20 MED ORDER — FENTANYL CITRATE 0.05 MG/ML IJ SOLN
INTRAMUSCULAR | Status: DC | PRN
  Administered 2013-12-20: 100 ug via INTRAVENOUS
  Administered 2013-12-20 (×2): 50 ug via INTRAVENOUS
  Administered 2013-12-20: 150 ug via INTRAVENOUS
  Administered 2013-12-20: 100 ug via INTRAVENOUS
  Administered 2013-12-20: 50 ug via INTRAVENOUS

## 2013-12-20 MED ORDER — PROPOFOL 10 MG/ML IV BOLUS
INTRAVENOUS | Status: DC | PRN
  Administered 2013-12-20: 200 mg via INTRAVENOUS

## 2013-12-20 MED ORDER — LIDOCAINE HCL (CARDIAC) 20 MG/ML IV SOLN
INTRAVENOUS | Status: DC | PRN
  Administered 2013-12-20: 100 mg via INTRAVENOUS

## 2013-12-20 SURGICAL SUPPLY — 73 items
APL SKNCLS STERI-STRIP NONHPOA (GAUZE/BANDAGES/DRESSINGS) ×1
BENZOIN TINCTURE PRP APPL 2/3 (GAUZE/BANDAGES/DRESSINGS) ×1 IMPLANT
BLADE SURG 10 STRL SS (BLADE) ×2 IMPLANT
BLADE SURG ROTATE 9660 (MISCELLANEOUS) IMPLANT
BOLT 5.0X50 SPINAL (Bolt) ×2 IMPLANT
CLSR STERI-STRIP ANTIMIC 1/2X4 (GAUZE/BANDAGES/DRESSINGS) ×1 IMPLANT
CORDS BIPOLAR (ELECTRODE) ×2 IMPLANT
COVER SURGICAL LIGHT HANDLE (MISCELLANEOUS) ×2 IMPLANT
DRAPE C-ARM 42X72 X-RAY (DRAPES) ×2 IMPLANT
DRAPE POUCH INSTRU U-SHP 10X18 (DRAPES) ×2 IMPLANT
DRAPE SURG 17X23 STRL (DRAPES) ×5 IMPLANT
DRAPE U-SHAPE 47X51 STRL (DRAPES) ×2 IMPLANT
DRSG MEPILEX BORDER 4X8 (GAUZE/BANDAGES/DRESSINGS) ×2 IMPLANT
DURAPREP 26ML APPLICATOR (WOUND CARE) ×2 IMPLANT
ELECT BLADE 6.5 EXT (BLADE) ×2 IMPLANT
ELECT CAUTERY BLADE 6.4 (BLADE) ×2 IMPLANT
ELECT REM PT RETURN 9FT ADLT (ELECTROSURGICAL) ×2
ELECTRODE REM PT RTRN 9FT ADLT (ELECTROSURGICAL) ×1 IMPLANT
GAUZE SPONGE 4X4 16PLY XRAY LF (GAUZE/BANDAGES/DRESSINGS) ×2 IMPLANT
GLOVE BIO SURGEON STRL SZ7 (GLOVE) ×5 IMPLANT
GLOVE BIO SURGEON STRL SZ8 (GLOVE) ×2 IMPLANT
GLOVE BIOGEL PI IND STRL 6 (GLOVE) IMPLANT
GLOVE BIOGEL PI IND STRL 6.5 (GLOVE) IMPLANT
GLOVE BIOGEL PI IND STRL 7.0 (GLOVE) ×1 IMPLANT
GLOVE BIOGEL PI IND STRL 8 (GLOVE) ×1 IMPLANT
GLOVE BIOGEL PI INDICATOR 6 (GLOVE) ×1
GLOVE BIOGEL PI INDICATOR 6.5 (GLOVE) ×3
GLOVE BIOGEL PI INDICATOR 7.0 (GLOVE) ×3
GLOVE BIOGEL PI INDICATOR 8 (GLOVE) ×1
GOWN STRL REUS W/ TWL LRG LVL3 (GOWN DISPOSABLE) ×1 IMPLANT
GOWN STRL REUS W/ TWL XL LVL3 (GOWN DISPOSABLE) ×2 IMPLANT
GOWN STRL REUS W/TWL LRG LVL3 (GOWN DISPOSABLE) ×2
GOWN STRL REUS W/TWL XL LVL3 (GOWN DISPOSABLE) ×4
IMPLANT COROENT XL 12X22X55 ×1 IMPLANT
KIT BASIN OR (CUSTOM PROCEDURE TRAY) ×2 IMPLANT
KIT DILATOR XLIF 5 (KITS) IMPLANT
KIT MAXCESS (KITS) ×1 IMPLANT
KIT NDL NVM5 EMG ELECT (KITS) IMPLANT
KIT NEEDLE NVM5 EMG ELECT (KITS) ×1 IMPLANT
KIT NEEDLE NVM5 EMG ELECTRODE (KITS) ×1
KIT ROOM TURNOVER OR (KITS) ×2 IMPLANT
KIT XLIF (KITS) ×1
NDL HYPO 25GX1X1/2 BEV (NEEDLE) ×1 IMPLANT
NDL SPNL 18GX3.5 QUINCKE PK (NEEDLE) ×1 IMPLANT
NEEDLE HYPO 25GX1X1/2 BEV (NEEDLE) ×2 IMPLANT
NEEDLE SPNL 18GX3.5 QUINCKE PK (NEEDLE) ×2 IMPLANT
NS IRRIG 1000ML POUR BTL (IV SOLUTION) ×2 IMPLANT
PACK LAMINECTOMY ORTHO (CUSTOM PROCEDURE TRAY) ×2 IMPLANT
PACK UNIVERSAL I (CUSTOM PROCEDURE TRAY) ×2 IMPLANT
PAD ARMBOARD 7.5X6 YLW CONV (MISCELLANEOUS) ×5 IMPLANT
PLATE 4H DECADE SPINAL (Plate) ×1 IMPLANT
PUTTY BONE DBX 2.5 MIS (Bone Implant) ×1 IMPLANT
PUTTY BONE DBX 5CC MIX (Putty) ×1 IMPLANT
SCREW DECADE 5.5X50 (Screw) ×2 IMPLANT
SPONGE GAUZE 4X4 12PLY STER LF (GAUZE/BANDAGES/DRESSINGS) ×1 IMPLANT
SPONGE INTESTINAL PEANUT (DISPOSABLE) ×3 IMPLANT
SPONGE LAP 4X18 X RAY DECT (DISPOSABLE) ×4 IMPLANT
SPONGE SURGIFOAM ABS GEL 100 (HEMOSTASIS) IMPLANT
STRIP CLOSURE SKIN 1/2X4 (GAUZE/BANDAGES/DRESSINGS) ×1 IMPLANT
SURGIFLO TRUKIT (HEMOSTASIS) IMPLANT
SUT MNCRL AB 4-0 PS2 18 (SUTURE) ×2 IMPLANT
SUT PDS AB 1 CTX 36 (SUTURE) ×2 IMPLANT
SUT PROLENE 5 0 C 1 24 (SUTURE) IMPLANT
SUT SILK 2 0 TIES 10X30 (SUTURE) IMPLANT
SUT VIC AB 0 CT1 18XCR BRD 8 (SUTURE) ×1 IMPLANT
SUT VIC AB 0 CT1 8-18 (SUTURE) ×2
SUT VIC AB 2-0 CT2 18 VCP726D (SUTURE) ×2 IMPLANT
SYR BULB IRRIGATION 50ML (SYRINGE) ×2 IMPLANT
TOWEL OR 17X24 6PK STRL BLUE (TOWEL DISPOSABLE) ×2 IMPLANT
TOWEL OR 17X26 10 PK STRL BLUE (TOWEL DISPOSABLE) ×2 IMPLANT
TRAY FOLEY CATH 16FR SILVER (SET/KITS/TRAYS/PACK) ×2 IMPLANT
WATER STERILE IRR 1000ML POUR (IV SOLUTION) ×1 IMPLANT
YANKAUER SUCT BULB TIP NO VENT (SUCTIONS) ×2 IMPLANT

## 2013-12-20 NOTE — Anesthesia Postprocedure Evaluation (Signed)
  Anesthesia Post-op Note  Patient: Peter [name]  Procedure(s) Performed: Procedure(s) with comments: ANTERIOR LATERAL LUMBAR FUSION 1 LEVEL (Left) - Left lumbar 3-4 extreme left  lateral interbody fusion with instrumentation and allograft  Patient Location: PACU  Anesthesia Type:General  Level of Consciousness: awake, alert  and oriented  Airway and Oxygen Therapy: Patient Spontanous Breathing and Patient connected to face mask oxygen  Post-op Pain: moderate  Post-op Assessment: Post-op Vital signs reviewed  Post-op Vital Signs: Reviewed  Last Vitals:  Filed Vitals:   12/20/13 1601  BP:   Pulse:   Temp: 36.5 C  Resp:     Complications: No apparent anesthesia complications

## 2013-12-20 NOTE — Transfer of Care (Signed)
Immediate Anesthesia Transfer of Care Note  Patient: Peter [name]  Procedure(s) Performed: Procedure(s) with comments: ANTERIOR LATERAL LUMBAR FUSION 1 LEVEL (Left) - Left lumbar 3-4 extreme left  lateral interbody fusion with instrumentation and allograft  Patient Location: PACU  Anesthesia Type:General  Level of Consciousness: awake and alert   Airway & Oxygen Therapy: Patient Spontanous Breathing and Patient connected to nasal cannula oxygen  Post-op Assessment: Report given to PACU RN and Post -op Vital signs reviewed and stable  Post vital signs: Reviewed and stable  Complications: No apparent anesthesia complications

## 2013-12-20 NOTE — H&P (Signed)
     PREOPERATIVE H&P  Chief Complaint: bilateral leg pain  HPI: Peter [name] is a 53 y.o. male who presents with ongoing pain in the bilateral legs  MRI reveals stenosis at L3/4 - above the patient's previous fusion  Patient has failed multiple forms of conservative care and continues to have pain (see office notes for additional details regarding the patient's full course of treatment)  Past Medical History  Diagnosis Date  . Hypertension   . Arthritis    Past Surgical History  Procedure Laterality Date  . Wrist surgery    . Back fusion    . Joint replacement      lt knee   acl  . Anterior cervical decomp/discectomy fusion  03/01/2012    Procedure: ANTERIOR CERVICAL DECOMPRESSION/DISCECTOMY FUSION 2 LEVELS;  Surgeon: Venita Lick, MD;  Location: [facility] OR;  Service: Orthopedics;  Laterality: N/A;  ACDF C5-7  . Back surgery     History   Social History  . Marital Status: Married    Spouse Name: N/A    Number of Children: N/A  . Years of Education: N/A   Social History Main Topics  . Smoking status: Current Every Day Smoker -- 1.00 packs/day for 30 years    Types: Cigarettes  . Smokeless tobacco: Not on file  . Alcohol Use: Yes     Comment: occ  . Drug Use: No  . Sexual Activity: Not on file   Other Topics Concern  . Not on file   Social History Narrative  . No narrative on file   No family history on file. No Known Allergies Prior to Admission medications   Medication Sig Start Date End Date Taking? Authorizing Provider  amitriptyline (ELAVIL) 100 MG tablet Take 100 mg by mouth at bedtime.   Yes Historical Provider, MD  lisinopril-hydrochlorothiazide (PRINZIDE,ZESTORETIC) 20-25 MG per tablet Take 1 tablet by mouth daily.   Yes Historical Provider, MD  morphine (MSIR) 15 MG tablet Take 15 mg by mouth every 8 (eight) hours as needed for severe pain.   Yes Historical Provider, MD     All other systems have been reviewed and were otherwise negative with the  exception of those mentioned in the HPI and as above.  Physical Exam: There were no vitals filed for this visit.  General: Alert, no acute distress Cardiovascular: No pedal edema Respiratory: No cyanosis, no use of accessory musculature Skin: No lesions in the area of chief complaint Neurologic: Sensation intact distally Psychiatric: Patient is competent for consent with normal mood and affect Lymphatic: No axillary or cervical lymphadenopathy  MUSCULOSKELETAL: + TTP low back  Assessment/Plan: Leg pain Plan for Procedure(s): ANTERIOR LATERAL LUMBAR FUSION 1 LEVEL   Emilee Hero, MD 12/20/2013 7:01 AM

## 2013-12-20 NOTE — Plan of Care (Signed)
Problem: Consults Goal: Diagnosis - Spinal Surgery Thoraco/Lumbar Spine Fusion Left anterior lumbar fusion L3-4

## 2013-12-20 NOTE — Anesthesia Preprocedure Evaluation (Signed)
Anesthesia Evaluation  Patient identified by MRN, date of birth, ID band Patient awake  General Assessment Comment:Chronic narcotic use  Reviewed: Allergy & Precautions, H&P , NPO status , Patient's Chart, lab work & pertinent test results  Airway Mallampati: II TM Distance: >3 FB Neck ROM: Full    Dental no notable dental hx.    Pulmonary Current Smoker,  breath sounds clear to auscultation  Pulmonary exam normal       Cardiovascular hypertension, Pt. on medications Rhythm:Regular Rate:Normal     Neuro/Psych negative neurological ROS  negative psych ROS   GI/Hepatic negative GI ROS, Neg liver ROS,   Endo/Other  negative endocrine ROS  Renal/GU negative Renal ROS  negative genitourinary   Musculoskeletal negative musculoskeletal ROS (+)   Abdominal   Peds negative pediatric ROS (+)  Hematology negative hematology ROS (+)   Anesthesia Other Findings   Reproductive/Obstetrics negative OB ROS                           Anesthesia Physical Anesthesia Plan  ASA: II  Anesthesia Plan: General   Post-op Pain Management:    Induction: Intravenous  Airway Management Planned: Oral ETT  Additional Equipment:   Intra-op Plan:   Post-operative Plan: Extubation in OR  Informed Consent: I have reviewed the patients History and Physical, chart, labs and discussed the procedure including the risks, benefits and alternatives for the proposed anesthesia with the patient or authorized representative who has indicated his/her understanding and acceptance.   Dental advisory given  Plan Discussed with: CRNA and Surgeon  Anesthesia Plan Comments:         Anesthesia Quick Evaluation

## 2013-12-21 ENCOUNTER — Encounter (HOSPITAL_COMMUNITY): Payer: Self-pay | Admitting: General Practice

## 2013-12-21 NOTE — Progress Notes (Signed)
    Patient doing moderately well, resolved right leg pain. Reports L anterior/lateral thigh pain and numbness since surgery, mildly improved since yesterday. L leg pain is worsened with hip flx. Pt has not been OOB yet, he is eager to ambulate. Difficult time sleeping secondary to PCA noise. Ate breakfast w/o difficulty.   Physical Exam: BP 103/55  Pulse 62  Temp(Src) 97.7 F (36.5 C) (Oral)  Resp 16  Wt 96.163 kg (212 lb)  SpO2 99%  Pt laying in hospital bed comfortably, SCD's in place, L side bandage CDI. Increased L leg pain with resisted hip FLX, sensation intact. Full strength BIL LE's. NVI  POD #1 s/p L approach L3-4 XLIF  - Resolved pre-op R leg pain - Intermittent L leg pain in ant/lat thigh reproduced with hip FLX, suspicious for L psoas pain/irritation from L sided    approach, will cont to monitor closely  - up with PT/OT, encourage ambulation - D/C PCA - Morphine (Home med) for pain, Valium for muscle spasms  - Pt has morphine at home prescribed through PCP  - Valium Script signed and in chart  - Can consider trial of Oxycontin Q12 if needed  - likely d/c home Saturday after PT/OT - F/U in office 2 weeks

## 2013-12-21 NOTE — Op Note (Signed)
NAMJoylene Igo:  [name], Peter [name]               ACCOUNT NO.:  [id number]  MEDICAL RECORD NO.:  [id number]  LOCATION:  [id number]                        FACILITY:  [facility]  PHYSICIAN:  Estill BambergMark Tamana Hatfield, MD      DATE OF BIRTH:  06-11-1961  DATE OF PROCEDURE:  12/20/2013                              OPERATIVE REPORT   PREOPERATIVE DIAGNOSES: 1. L3-4 degenerative disk disease. 2. 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 3. Status post L4-S1 fusion.  POSTOPERATIVE DIAGNOSES: 1. L3-4 degenerative disk disease. 2. 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 3. Status post L4-S1 fusion.  PROCEDURE: 1. L3-4 anterior lumbar interbody fusion via a minimally invasive     lateral approach. 2. Placement of anterior instrumentation, L3-4. 3. Insertion of interbody device x1 (NuVasive 12 x 22 x 55 mm,     interbody peek spacer). 4. Intraoperative use of fluoroscopy. 5. Use of morselized allograft-DBX mixed.  SURGEON:  Estill BambergMark Belinda Bringhurst, MD  ASSISTANT:  Peter CoopKayla McKenzie, PA-C  ANESTHESIA:  General endotracheal anesthesia.  COMPLICATIONS:  None.  DISPOSITION:  Stable.  ESTIMATED BLOOD LOSS:  Minimal.  INDICATIONS FOR PROCEDURE:  Briefly, Peter [name] is a 53 year old male, who did present to me with multiple musculoskeletal complaints, including pain in his low back and right leg.  I did review an MRI, which was notable for stenosis and degenerative disk disease at L3-4. The patient did fail conservative care and did wish to proceed with surgery as reflected above.  The patient did fully understand the risks and limitations of the procedure as outlined in my preoperative note.  OPERATIVE DETAILS:  On December 20, 2013, the patient was brought to Surgery and general endotracheal anesthesia was administered.  The patient was placed in the lateral decubitus position with the left side up.  The region of the left flank was prepped and draped in the usual sterile fashion.  Of note, neurologic monitoring  was used throughout the surgery.  The appropriate leads were placed.  Baseline EMGs were noted. After time-out procedure was performed, I did make a 3-cm incision overlying the L3-4 interspace.  The external and internal oblique fascia was dissected, the transversalis fascia was dissected and the retroperitoneal space was noted.  The peritoneum was bluntly swept anteriorly.  The stylus was noted and the initial dilator was placed through the stylus.  I did use neurologic monitoring and while placing the dilator to ensure that there was no neurologic structures in the vicinity of the dilator.  A second and a third dilator was then advanced, and a self-retaining retractor was placed.  Neurologic monitoring did confirm that there was no neurologic structures in the immediate vicinity of the exposure.  The intervertebral disk was noted. I did use a 15-blade knife to perform an annulotomy.  I then used a series of curettes and pituitary rongeurs to perform a thorough and complete diskectomy.  The endplates were appropriately prepared.  I was very pleased with the diskectomy.  There was significant cavity at the upper endplate, to which I did even out using a 10 mm paddle scraper.  I then placed a series of interbody spacers and I did feel that a 12 x 55  mm spacer would be the most appropriate fit.  The appropriate-sized spacer was then packed with DBX mix and tamped into position in the usual fashion.  I was very pleased with the press fit of the implant.  I then placed a 4-hole plate over the lateral aspect of the spine.  Two screws were placed in each vertebral body for a total of 4 screws.  The bed was then leveled and the screws were locked to the plate.  I did open the plate slightly and the locking mechanism on the plate itself was also utilized.  Of note, I did liberally use AP and lateral fluoroscopy throughout the surgery.  I was very pleased with the appearance throughout the  surgery, and with the final appearance of the radiographs.  The wound was then copiously irrigated.  The fascia was then closed using #1 Vicryl.  The subcutaneous layer was closed using 2- 0 Vicryl.  The skin was closed using 3-0 Monocryl.  Benzoin and Steri- Strips were applied followed by a sterile dressing.  All instrument counts were correct at the termination of the procedure.  Of note, Peter [name] was my assistant throughout the surgery, and did aid in retraction, suctioning, and closure.     Estill BambergMark Aviyanna Colbaugh, MD     MD/MEDQ  D:  12/20/2013  T:  12/21/2013  Job:  [id number]

## 2013-12-21 NOTE — Progress Notes (Signed)
Utilization review completed.  

## 2013-12-21 NOTE — Evaluation (Signed)
Physical Therapy Evaluation and Discharge Patient Details Name: Peter AltBobby E Adamik MRN: [id number] DOB: Jul 12, 1961 Today's Date: 12/21/2013   History of Present Illness  53 y.o. male s/p L4-S1 fusion. Hx of cervical decompression/fusion C5-7 and HTN.  Clinical Impression  Patient evaluated by Physical Therapy with no further acute PT needs identified. All education has been reviewed and the patient has no further questions. Pt safely completed stair training and demonstrates minor balance difficulty due to LLE numbness with ambulation however is very safe with rolling walker use for support. Pt states he does not need any further PT services. See below for any follow-up Physial Therapy or equipment needs. PT is signing off. Thank you for this referral.     Follow Up Recommendations No PT follow up    Equipment Recommendations  Rolling walker with 5" wheels    Recommendations for Other Services       Precautions / Restrictions Precautions Precautions: Back Precaution Booklet Issued: Yes (comment) Precaution Comments: Reviewed back precautions and donning/doffing spinal brace Required Braces or Orthoses: Spinal Brace Restrictions Weight Bearing Restrictions: No      Mobility  Bed Mobility Overal bed mobility: Needs Assistance Bed Mobility: Supine to Sit;Sit to Supine     Supine to sit: Supervision Sit to supine: Supervision   General bed mobility comments: Supervision for safety with verbal cues for log roll technique. Performs safely and pt has no further questions concerning this task.  Transfers Overall transfer level: Needs assistance Equipment used: Rolling walker (2 wheeled) Transfers: Sit to/from Stand Sit to Stand: Supervision         General transfer comment: Supervision for safety, performed from recliner and lowest bed setting. VCs for hand placement and technique to safely maintain back precautions.  Ambulation/Gait Ambulation/Gait assistance:  Supervision Ambulation Distance (Feet): 125 Feet (x2) Assistive device: Rolling walker (2 wheeled);None Gait Pattern/deviations: Step-through pattern;Decreased stride length;Antalgic   Gait velocity interpretation: Below normal speed for age/gender General Gait Details: Supervision for safety with education for safe DME use. Trial of ambulation without RW and demonstrate minimal balance loss due to antalgic type gait on LLE.  Stairs Stairs: Yes Stairs assistance: Min guard Stair Management: Two rails;Step to pattern;Forwards Number of Stairs: 2 (x2) General stair comments: Cues for sequencing and technique with use of UEs on rails. Performs this task safely at min guard level and demonstrates good control.   Wheelchair Mobility    Modified Rankin (Stroke Patients Only)       Balance Overall balance assessment: Needs assistance Sitting-balance support: No upper extremity supported;Feet supported Sitting balance-Leahy Scale: Good     Standing balance support: No upper extremity supported Standing balance-Leahy Scale: Fair                               Pertinent Vitals/Pain Pt reports pain 7/10 Nurse aware Patient repositioned in chair for comfort.     Home Living Family/patient expects to be discharged to:: Private residence Living Arrangements: Spouse/significant other;Children Available Help at Discharge: Family;Available 24 hours/day Type of Home: Mobile home Home Access: Stairs to enter Entrance Stairs-Rails: Right;Left;Can reach both Entrance Stairs-Number of Steps: 3 Home Layout: One level Home Equipment: Cane - single point;Shower seat - built in;Grab bars - toilet;Grab bars - tub/shower      Prior Function Level of Independence: Independent with assistive device(s)         Comments: Intermittent use of Cane for ambulation  Hand Dominance   Dominant Hand: Right    Extremity/Trunk Assessment   Upper Extremity Assessment: Defer to  OT evaluation           Lower Extremity Assessment: LLE deficits/detail   LLE Deficits / Details: pain with hip flexion 3/5 MMT - decreased sensation over left anterolateral thigh     Communication   Communication: No difficulties  Cognition Arousal/Alertness: Awake/alert Behavior During Therapy: WFL for tasks assessed/performed Overall Cognitive Status: Within Functional Limits for tasks assessed                      General Comments General comments (skin integrity, edema, etc.): Reviewed donning/doffing spinal brace. Complains of mild pain over left flank surgical site with brace in place but states this is tolerable. Pt verbalizes understanding 3/3 back precautions. Wife was present during part of therapy session and all of her questions have been answered concerning pt care at home.    Exercises General Exercises - Lower Extremity Ankle Circles/Pumps: AROM;Both;10 reps;Seated      Assessment/Plan    PT Assessment Patent does not need any further PT services  PT Diagnosis     PT Problem List    PT Treatment Interventions     PT Goals (Current goals can be found in the Care Plan section) Acute Rehab PT Goals Patient Stated Goal: go home PT Goal Formulation: No goals set, d/c therapy    Frequency     Barriers to discharge        Co-evaluation               End of Session Equipment Utilized During Treatment: Back brace Activity Tolerance: Patient tolerated treatment well Patient left: in chair;with call bell/phone within reach;with family/visitor present Nurse Communication: Mobility status         Time: [id number] PT Time Calculation (min): 36 min   Charges:   PT Evaluation $Initial PT Evaluation Tier I: 1 Procedure PT Treatments $Gait Training: 8-22 mins $Therapeutic Activity: 8-22 mins   PT G Codes:         Charlsie MerlesLogan Secor Rodneshia Greenhouse, [place]PT [phone]  Berton MountBarbour, Mariyanna Mucha S 12/21/2013, 12:23 PM

## 2013-12-21 NOTE — Evaluation (Signed)
I have read and agree with this note.   Time in/out:13:17-13:27 Total time:10 minutes (Ev)  Ignacia Palmaathy Shaneya Taketa, OTR/L [phone]

## 2013-12-21 NOTE — Evaluation (Signed)
Occupational Therapy Evaluation and Discharge Patient Details Name: Peter [name] MRN: [id number] DOB: [birth year] Today's Date: 12/21/2013    History of Present Illness 53 y.o. male s/p L4-S1 fusion. Hx of cervical decompression/fusion C5-7 and HTN.   Clinical Impression   Present with decreased functional mobility from above limiting his independence with ADLs. This is the pt's third back surgery and is familiar with proper techniques for ADLs, transfers and bed mobility. Precautions and compensatory strategies were reviewed with the pt. PTA, pt was independent with assistive devices and will have assistance at home from family upon d/c. No further OT is needed, we will sign off.     Follow Up Recommendations  Supervision/Assistance - 24 hour          Precautions / Restrictions Precautions Precautions: Back Precaution Booklet Issued: Yes (comment) Precaution Comments: Reviewed back precautions and donning/doffing spinal brace Required Braces or Orthoses: Spinal Brace      Mobility Bed Mobility Overal bed mobility: Needs Assistance Bed Mobility: Sidelying to Sit;Rolling Rolling: Modified independent (Device/Increase time) Sidelying to sit: Modified independent (Device/Increase time)       General bed mobility comments: Pt had proper and safe log rolling and sidelying to sit technique   Transfers Overall transfer level: Modified independent Equipment used: Rolling walker (2 wheeled) Transfers: Sit to/from Stand Sit to Stand: Supervision;Modified independent (Device/Increase time)              Balance     Sitting balance-Leahy Scale: Good       Standing balance-Leahy Scale: Fair                              ADL Overall ADL's : Needs assistance/impaired Eating/Feeding: Independent;Sitting   Grooming: Standing;Set up   Upper Body Bathing: Sitting;Set up   Lower Body Bathing: Adhering to back precautions;Minimal assistance;Sit to/from  stand;With caregiver independent assisting   Upper Body Dressing : Sitting;Set up   Lower Body Dressing: Minimal assistance;Adhering to back precautions;With caregiver independent assisting;Sit to/from stand   Toilet Transfer: Visual merchandiserGrab bars;RW;Regular Toilet;Ambulation;Modified Independent   Toileting- Clothing Manipulation and Hygiene: Adhering to back precautions;Sit to/from stand;Modified independent       Functional mobility during ADLs: Rolling walker;Modified independent General ADL Comments: Pt's had 2 previous back surgeries and is familiar with ADL, bed mobility and transfer compensatory strategies. Pt was educated on different way to sit down on toilet by widening his stance and pointing toes outward while adhering to back precautions.               Pertinent Vitals/Pain No c/o pain. Pt was premedicated.     Hand Dominance Right   Extremity/Trunk Assessment Upper Extremity Assessment Upper Extremity Assessment: Overall WFL for tasks assessed           Communication Communication Communication: No difficulties   Cognition Arousal/Alertness: Awake/alert Behavior During Therapy: WFL for tasks assessed/performed Overall Cognitive Status: Within Functional Limits for tasks assessed                                Home Living Family/patient expects to be discharged to:: Private residence Living Arrangements: Spouse/significant other;Children Available Help at Discharge: Family;Available 24 hours/day Type of Home: Mobile home Home Access: Stairs to enter Entrance Stairs-Number of Steps: 3 Entrance Stairs-Rails: Right;Left;Can reach both Home Layout: One level     Bathroom Shower/Tub: Arts development officerWalk-in shower   Bathroom  Toilet: Standard     Home Equipment: Cane - single point;Shower seat - built in;Grab bars - toilet;Grab bars - tub/shower          Prior Functioning/Environment Level of Independence: Independent with assistive device(s)         Comments: Intermittent use of Cane for ambulation              OT Goals(Current goals can be found in the care plan section) Acute Rehab OT Goals Patient Stated Goal: go home OT Goal Formulation: With patient Time For Goal Achievement: 12/28/13 Potential to Achieve Goals: Good   End of Session Equipment Utilized During Treatment: Rolling walker;Back brace  Activity Tolerance: Patient tolerated treatment well Patient left: in bed;with call bell/phone within reach   Time:  -    Charges:    G-CodesMaurene Capes:    Elvina Bosch 12/21/2013, 3:22 PM

## 2013-12-22 MED ORDER — DIAZEPAM 5 MG PO TABS: 5.0000 mg | ORAL_TABLET | Freq: Four times a day (QID) | ORAL | Status: DC | PRN

## 2013-12-22 NOTE — Progress Notes (Signed)
Subjective: 2 Days Post-Op Procedure(s) (LRB): ANTERIOR LATERAL LUMBAR FUSION 1 LEVEL (Left) Patient reports pain as 3 on 0-10 scale.  Taking by mouth and voiding okay.  Doing well with PT.  Getting up to the bathroom without difficulty with brace on.  Denies right leg pain.  Mild numbness left anterior thigh.  Ready to go home.   Objective: Vital signs in last 24 hours: Temp:  [97.8 F (36.6 C)-99.5 F (37.5 C)] 98.1 F (36.7 C) (06/13 0623) Pulse Rate:  [69-76] 70 (06/13 0623) Resp:  [16-20] 20 (06/13 0623) BP: (115-138)/(57-71) 125/66 mmHg (06/13 0623) SpO2:  [95 %-98 %] 97 % (06/13 0623)  Intake/Output from previous day: 06/12 0701 - 06/13 0700 In: 2643 [P.O.:2640; I.V.:3] Out: 1850 [Urine:1850] Intake/Output this shift: Total I/O In: 320 [P.O.:320] Out: -   TLSO brace is fitting well.  Excellent ankle plantar and dorsiflexor strength bilaterally.  Normal sensation distally.  No quad weakness.patient appears comfortable in no acute distress.   Assessment/Plan: 2 Days Post-Op Procedure(s) (LRB): ANTERIOR LATERAL LUMBAR FUSION 1 LEVEL (Left) Plan: Discharge home. Wear brace when out of bed as instructed. Given Rx for Valium.  He has pain medication at home. Avoid bending, lifting, or twisting. Followup with Dr. Yevette Edwardsumonski in 2 weeks at scheduled appointment time.   Sapphira Harjo G 12/22/2013, 8:52 AM

## 2013-12-22 NOTE — Progress Notes (Signed)
Patient discharge orders unable to be printed due to reconciliation not being done. PA paged at 0945 and RN left message for callback regarding discharge. PA paged [phone] with no callback. Patient very anxious to return home with wife due to prior plans. RN verbally reviewed discharge instructions with patient. Patient denies questions or concerns. Written d/c instructions and medical discharge handout given to patient, along with prescriptions. IV removed with no complications. Patient d/c'd via wheelchair accompanied by wife with personal belongings and handouts.

## 2013-12-25 ENCOUNTER — Encounter (HOSPITAL_COMMUNITY): Payer: Self-pay | Admitting: Orthopedic Surgery

## 2013-12-25 NOTE — Addendum Note (Signed)
Addendum created 12/25/13 1324 by Eilene GhaziGeorge Rose, MD   Modules edited: Anesthesia Events

## 2013-12-27 NOTE — Discharge Summary (Signed)
Patient ID: Peter [name] MRN: [id number] [id number] DOB/AGE: May 21, 1961 53 y.o.  Admit date: 12/20/2013 Discharge date: 12/22/2013  Admission Diagnoses:  Active Problems:   Radiculopathy   Discharge Diagnoses:  Same  Past Medical History  Diagnosis Date  . Hypertension   . Arthritis     Surgeries: Procedure(s): ANTERIOR LATERAL LUMBAR FUSION 1 LEVEL Left L3-4 XLIF on 12/20/2013   Consultants:  none  Discharged Condition: Improved  Hospital Course: Delray AltBobby E Denbleyker is an 53 y.o. male who was admitted 12/20/2013 for operative treatment of radiculopathy. Patient has severe unremitting pain that affects sleep, daily activities, and work/hobbies. After pre-op clearance the patient was taken to the operating room on 12/20/2013 and underwent  Procedure(s): ANTERIOR LATERAL LUMBAR FUSION 1 LEVEL Left L3-4 XLIF.    Patient was given perioperative antibiotics:  Anti-infectives   Start     Dose/Rate Route Frequency Ordered Stop   12/20/13 2300  ceFAZolin (ANCEF) IVPB 1 g/50 mL premix     1 g 100 mL/hr over 30 Minutes Intravenous Every 8 hours 12/20/13 1740 12/21/13 0851   12/20/13 0600  ceFAZolin (ANCEF) IVPB 2 g/50 mL premix     2 g 100 mL/hr over 30 Minutes Intravenous On call to O.R. 12/19/13 1406 12/20/13 1139       Patient was given sequential compression devices, early ambulation to prevent DVT.  Patient benefited maximally from hospital stay and there were no complications.    Recent vital signs: BP 125/66  Pulse 70  Temp(Src) 98.1 F (36.7 C) (Axillary)  Resp 20  Wt 96.163 kg (212 lb)  SpO2 97%   Discharge Medications:     Medication List         amitriptyline 100 MG tablet  Commonly known as:  ELAVIL  Take 100 mg by mouth at bedtime.     diazepam 5 MG tablet  Commonly known as:  VALIUM  Take 1 tablet (5 mg total) by mouth every 6 (six) hours as needed for muscle spasms.     lisinopril-hydrochlorothiazide 20-25 MG per tablet  Commonly known as:   PRINZIDE,ZESTORETIC  Take 1 tablet by mouth daily.     morphine 15 MG tablet  Commonly known as:  MSIR  Take 15 mg by mouth every 8 (eight) hours as needed for severe pain.        Diagnostic Studies: Dg Chest 2 View  12/13/2013   CLINICAL DATA:  Pre-admission for lumbar surgery  EXAM: CHEST  2 VIEW  COMPARISON:  None.  FINDINGS: The heart size and mediastinal contours are within Peter limits. Both lungs are clear. The visualized skeletal structures are unremarkable.  IMPRESSION: No active cardiopulmonary disease.   Electronically Signed   By: Alcide CleverMark  Lukens M.D.   On: 12/13/2013 10:39   Dg Lumbar Spine 2-3 Views  12/20/2013   CLINICAL DATA:  L3-L4 XLIF and lateral plating  EXAM: DG C-ARM GT 120 MIN; LUMBAR SPINE - 2-3 VIEW  FLUOROSCOPY TIME:  1 min 44 seconds  COMPARISON:  Intraoperative images from 1515 hr compared to MRI lumbar spine 10/20/2013 and. lumbar radiographs of 10/11/2013  FINDINGS: Two digital C-arm fluoroscopic images submitted.  Images demonstrate prior placement of posterior pedicle screws and bars at L4-S1 post posterior fusion.  Disc prosthesis noted at L4-L5.  L5-S1 disc space not visualized.  Lateral screws and plate are present at L3-L4 post fusion.  Bones appear demineralized.  Disc prosthesis noted at L3-L4 disc space.  Vertebral body heights maintained.  IMPRESSION: Post  LEFT lateral fusion of L3-L4 as above.   Electronically Signed   By: Ulyses SouthwardMark  Boles M.D.   On: 12/20/2013 16:14   Dg C-arm Gt 120 Min  12/20/2013   CLINICAL DATA:  L3-L4 XLIF and lateral plating  EXAM: DG C-ARM GT 120 MIN; LUMBAR SPINE - 2-3 VIEW  FLUOROSCOPY TIME:  1 min 44 seconds  COMPARISON:  Intraoperative images from 1515 hr compared to MRI lumbar spine 10/20/2013 and. lumbar radiographs of 10/11/2013  FINDINGS: Two digital C-arm fluoroscopic images submitted.  Images demonstrate prior placement of posterior pedicle screws and bars at L4-S1 post posterior fusion.  Disc prosthesis noted at L4-L5.  L5-S1 disc  space not visualized.  Lateral screws and plate are present at L3-L4 post fusion.  Bones appear demineralized.  Disc prosthesis noted at L3-L4 disc space.  Vertebral body heights maintained.  IMPRESSION: Post LEFT lateral fusion of L3-L4 as above.   Electronically Signed   By: Ulyses SouthwardMark  Boles M.D.   On: 12/20/2013 16:14    Disposition: 01-Home or Self Care      Discharge Instructions   Call MD / Call 911    Complete by:  As directed   If you experience chest pain or shortness of breath, CALL 911 and be transported to the hospital emergency room.  If you develope a fever above 101 F, pus (white drainage) or increased drainage or redness at the wound, or calf pain, call your surgeon's office.     Constipation Prevention    Complete by:  As directed   Drink plenty of fluids.  Prune juice may be helpful.  You may use a stool softener, such as Colace (over the counter) 100 mg twice a day.  Use MiraLax (over the counter) for constipation as needed.     Increase activity slowly as tolerated    Complete by:  As directed   Wear brace when up.          -Wear brace when out of bed as instructed. -Written scripts for pain signed and in chart -D/C instructions sheet printed and in chart -D/C today  -F/U in office 2 weeks   Signed: Georga BoraMCKENZIE, Lewellyn Fultz J 12/27/2013, 4:20 PM

## 2014-01-09 ENCOUNTER — Encounter (HOSPITAL_COMMUNITY): Payer: Self-pay | Admitting: Orthopedic Surgery

## 2014-01-17 ENCOUNTER — Encounter (HOSPITAL_COMMUNITY): Payer: Self-pay | Admitting: Orthopedic Surgery

## 2014-07-24 ENCOUNTER — Other Ambulatory Visit: Payer: Self-pay | Admitting: Orthopedic Surgery

## 2014-07-24 DIAGNOSIS — M545 Low back pain, unspecified: Secondary | ICD-10-CM

## 2014-07-24 DIAGNOSIS — G8929 Other chronic pain: Secondary | ICD-10-CM

## 2014-07-25 ENCOUNTER — Ambulatory Visit
Admission: RE | Admit: 2014-07-25 | Discharge: 2014-07-25 | Disposition: A | Discharge status: Home / Self Care | Payer: Medicare Other | Source: Ambulatory Visit | Attending: Orthopedic Surgery | Admitting: Orthopedic Surgery

## 2014-07-25 DIAGNOSIS — M545 Low back pain, unspecified: Secondary | ICD-10-CM

## 2014-07-25 DIAGNOSIS — G8929 Other chronic pain: Secondary | ICD-10-CM

## 2018-09-27 ENCOUNTER — Other Ambulatory Visit: Payer: Self-pay | Admitting: Neurosurgery

## 2018-09-27 DIAGNOSIS — M48061 Spinal stenosis, lumbar region without neurogenic claudication: Secondary | ICD-10-CM

## 2018-09-28 ENCOUNTER — Other Ambulatory Visit: Payer: Self-pay

## 2018-09-28 ENCOUNTER — Ambulatory Visit
Admission: RE | Admit: 2018-09-28 | Discharge: 2018-09-28 | Disposition: A | Discharge status: Home / Self Care | Payer: Medicare Other | Source: Ambulatory Visit | Attending: Neurosurgery | Admitting: Neurosurgery

## 2018-09-28 DIAGNOSIS — M48061 Spinal stenosis, lumbar region without neurogenic claudication: Secondary | ICD-10-CM

## 2018-09-29 ENCOUNTER — Other Ambulatory Visit: Payer: Self-pay | Admitting: Neurosurgery

## 2018-10-25 NOTE — Progress Notes (Signed)
[id number], [place] - [address] [place] [address] AT [facility] [address] [place] [address] Phone: [phone] Fax: [phone]      Your procedure is scheduled on April 29  [facility]" Then check in at Admitting at 0530 A.M.  Call this number if you have problems the morning of surgery:  [phone]  Call [phone] if you have any questions prior to your surgery date Monday-Friday 8am-4pm    Remember:  Do not eat or drink after midnight.    Take these medicines the morning of surgery with A SIP OF WATER  cyclobenzaprine (FLEXERIL)  If needed lithium carbonate  traMADol (ULTRAM) if needed    7 days prior to surgery STOP taking any Aspirin (unless otherwise instructed by your surgeon), Aleve, Naproxen, Ibuprofen, Motrin, Advil, Goody's, BC's, all herbal medications, fish oil, and all vitamins.    The Morning of Surgery  Do not wear jewelry, make-up or nail polish.  Do not wear lotions, powders, or perfumes/colognes, or deodorant  Do not shave 48 hours prior to surgery.  Men may shave face and neck.  Do not bring valuables to the hospital.  [facility] is not responsible for any belongings or valuables.  If you are a smoker, DO NOT Smoke 24 hours prior to surgery  REMEMBER THAT YOU MUST SOMEONE TO TRANSPORT YOU HOME AFTER SURGERY IF YOU ARE DISCHARGED THE SAME DAY OF SURGERY  YOU WILL ALSO NEED SOMEONE TO STAY WITH YOU FOR 24 HOURS AFTER SURGERY IF YOU ARE DISCHARGED THE SAME DAY OF SURGERY   Contacts, dentures or bridgework may not be worn into surgery.   If you wear a CPAP at night please bring your mask the morning of surgery   Leave your suitcase in the car.  After surgery it may be brought to your room.  For patients admitted to the hospital, discharge time will be determined by your treatment team.  Patients discharged the day of surgery will not be allowed to drive home.    Special instructions:    [facility]- Preparing For Surgery  Before surgery, you can play an important role. Because skin is not sterile, your skin needs to be as free of germs as possible. You can reduce the number of germs on your skin by washing with CHG (chlorahexidine gluconate) Soap before surgery.  CHG is an antiseptic cleaner which kills germs and bonds with the skin to continue killing germs even after washing.    Oral Hygiene is also important to reduce your risk of infection.  Remember - BRUSH YOUR TEETH THE MORNING OF SURGERY WITH YOUR REGULAR TOOTHPASTE  Please do not use if you have an allergy to CHG or antibacterial soaps. If your skin becomes reddened/irritated stop using the CHG.  Do not shave (including legs and underarms) for at least 48 hours prior to first CHG shower. It is OK to shave your face.  Please follow these instructions carefully.   1. Shower the NIGHT BEFORE SURGERY and the MORNING OF SURGERY with CHG Soap.   2. If you chose to wash your hair, wash your hair first as usual with your normal shampoo.  3. After you shampoo, rinse your hair and body thoroughly to remove the shampoo.  4. Use CHG as you would any other liquid soap. You can apply CHG directly to the skin and wash gently with a scrungie or a clean washcloth.   5. Apply the CHG Soap  to your body ONLY FROM THE NECK DOWN.  Do not use on open wounds or open sores. Avoid contact with your eyes, ears, mouth and genitals (private parts). Wash Face and genitals (private parts)  with your normal soap.   6. Wash thoroughly, paying special attention to the area where your surgery will be performed.  7. Thoroughly rinse your body with warm water from the neck down.  8. DO NOT shower/wash with your normal soap after using and rinsing off the CHG Soap.  9. Pat yourself dry with a CLEAN TOWEL.  10. Wear CLEAN PAJAMAS to bed the night before surgery, wear comfortable clothes the morning of surgery  11. Place CLEAN SHEETS on your bed  the night of your first shower and DO NOT SLEEP WITH PETS.    Day of Surgery:  Do not apply any deodorants/lotions.  Please wear clean clothes to the hospital/surgery center.   Remember to brush your teeth WITH YOUR REGULAR TOOTHPASTE.   Please read over the following fact sheets that you were given.

## 2018-10-26 ENCOUNTER — Other Ambulatory Visit: Payer: Self-pay

## 2018-10-26 ENCOUNTER — Encounter (HOSPITAL_COMMUNITY): Payer: Self-pay

## 2018-10-26 ENCOUNTER — Encounter (HOSPITAL_COMMUNITY)
Admission: RE | Admit: 2018-10-26 | Discharge: 2018-10-26 | Disposition: A | Discharge status: Home / Self Care | Payer: Medicare Other | Source: Ambulatory Visit | Attending: Neurosurgery | Admitting: Neurosurgery

## 2018-10-26 DIAGNOSIS — Z01812 Encounter for preprocedural laboratory examination: Secondary | ICD-10-CM | POA: Diagnosis not present

## 2018-10-26 HISTORY — DX: Anxiety disorder, unspecified: F41.9

## 2018-10-26 HISTORY — DX: Major depressive disorder, single episode, unspecified: F32.9

## 2018-10-26 HISTORY — DX: Bipolar disorder, unspecified: F31.9

## 2018-10-26 HISTORY — DX: Presence of dental prosthetic device (complete) (partial): Z97.2

## 2018-10-26 HISTORY — DX: Depression, unspecified: F32.A

## 2018-10-26 HISTORY — DX: Hyperlipidemia, unspecified: E78.5

## 2018-10-26 LAB — BASIC METABOLIC PANEL
Anion gap: 10 (ref 5–15)
BUN: 11 mg/dL (ref 6–20)
CO2: 22 mmol/L (ref 22–32)
Calcium: 9.4 mg/dL (ref 8.9–10.3)
Chloride: 103 mmol/L (ref 98–111)
Creatinine, Ser: 0.98 mg/dL (ref 0.61–1.24)
GFR calc Af Amer: >60 mL/min (ref >60)
GFR calc non Af Amer: >60 mL/min (ref >60)
Glucose, Bld: 106 mg/dL — ABNORMAL HIGH (ref 70–99)
Potassium: 4 mmol/L (ref 3.5–5.1)
Sodium: 135 mmol/L (ref 135–145)

## 2018-10-26 LAB — CBC
HCT: 51 % (ref 39.0–52.0)
Hemoglobin: 16.7 g/dL (ref 13.0–17.0)
MCH: 31.9 pg (ref 26.0–34.0)
MCHC: 32.7 g/dL (ref 30.0–36.0)
MCV: 97.5 fL (ref 80.0–100.0)
Platelets: 181 10*3/uL (ref 150–400)
RBC: 5.23 MIL/uL (ref 4.22–5.81)
RDW: 13.2 % (ref 11.5–15.5)
WBC: 9 10*3/uL (ref 4.0–10.5)
nRBC: 0 % (ref 0.0–0.2)

## 2018-10-26 LAB — TYPE AND SCREEN
ABO/RH(D): O POS
Antibody Screen: NEGATIVE

## 2018-10-26 LAB — MRSA PCR SCREENING: MRSA by PCR: NEGATIVE

## 2018-10-26 NOTE — Progress Notes (Signed)
Patient denies SOB, Chest Pain, fever, cough, N/V.  PCP - Jodean Lima, PA at [facility] in [place], [place] Cardiologist - Denies  Chest x-ray - Denies EKG - 10/26/2018 Stress Test - Denies ECHO - Denies Cardiac Cath - Denies Sleep Study - Denies CPAP - N/A   Anesthesia review: NO  Coronavirus Screening  Have you experienced the following symptoms:  Cough yes/no: No Fever (>100.45F)  yes/no: No Runny nose yes/no: No Sore throat yes/no: No Difficulty breathing/shortness of breath  yes/no: No  Have you or your wife traveled in the last 14 days and where? yes/no: No  Patient verbalized understanding of instructions that were given to them at the PAT appointment. Patient was also instructed that they will need to review over the PAT instructions again at home before surgery.  Patient informed of the hospital current visitation restriction policy that is now in effect.

## 2018-11-07 NOTE — Progress Notes (Signed)
Denies fever, cough, shob, COVID-19 exposure, or travel since PAT appointment. 

## 2018-11-08 ENCOUNTER — Encounter (HOSPITAL_COMMUNITY)
Admission: RE | Disposition: A | Discharge status: Home / Self Care | Payer: Self-pay | Source: Home / Self Care | Attending: Neurosurgery

## 2018-11-08 ENCOUNTER — Observation Stay (HOSPITAL_COMMUNITY)
Admission: RE | Admit: 2018-11-08 | Discharge: 2018-11-09 | Disposition: A | Discharge status: Home / Self Care | Payer: Medicare Other | Attending: Neurosurgery | Admitting: Neurosurgery

## 2018-11-08 ENCOUNTER — Inpatient Hospital Stay (HOSPITAL_COMMUNITY): Payer: Medicare Other | Admitting: Vascular Surgery

## 2018-11-08 ENCOUNTER — Inpatient Hospital Stay (HOSPITAL_COMMUNITY): Payer: Medicare Other

## 2018-11-08 ENCOUNTER — Encounter (HOSPITAL_COMMUNITY): Payer: Self-pay | Admitting: *Deleted

## 2018-11-08 ENCOUNTER — Inpatient Hospital Stay (HOSPITAL_COMMUNITY): Payer: Medicare Other | Admitting: Anesthesiology

## 2018-11-08 ENCOUNTER — Other Ambulatory Visit: Payer: Self-pay

## 2018-11-08 DIAGNOSIS — M5126 Other intervertebral disc displacement, lumbar region: Secondary | ICD-10-CM | POA: Diagnosis not present

## 2018-11-08 DIAGNOSIS — E669 Obesity, unspecified: Secondary | ICD-10-CM | POA: Insufficient documentation

## 2018-11-08 DIAGNOSIS — M47816 Spondylosis without myelopathy or radiculopathy, lumbar region: Secondary | ICD-10-CM | POA: Diagnosis not present

## 2018-11-08 DIAGNOSIS — Z79899 Other long term (current) drug therapy: Secondary | ICD-10-CM | POA: Insufficient documentation

## 2018-11-08 DIAGNOSIS — M48061 Spinal stenosis, lumbar region without neurogenic claudication: Principal | ICD-10-CM | POA: Insufficient documentation

## 2018-11-08 DIAGNOSIS — Z87892 Personal history of anaphylaxis: Secondary | ICD-10-CM | POA: Diagnosis not present

## 2018-11-08 DIAGNOSIS — M532X6 Spinal instabilities, lumbar region: Secondary | ICD-10-CM | POA: Diagnosis not present

## 2018-11-08 DIAGNOSIS — I10 Essential (primary) hypertension: Secondary | ICD-10-CM | POA: Diagnosis not present

## 2018-11-08 DIAGNOSIS — E785 Hyperlipidemia, unspecified: Secondary | ICD-10-CM | POA: Diagnosis not present

## 2018-11-08 DIAGNOSIS — Z888 Allergy status to other drugs, medicaments and biological substances status: Secondary | ICD-10-CM | POA: Diagnosis not present

## 2018-11-08 DIAGNOSIS — F319 Bipolar disorder, unspecified: Secondary | ICD-10-CM | POA: Insufficient documentation

## 2018-11-08 DIAGNOSIS — F17210 Nicotine dependence, cigarettes, uncomplicated: Secondary | ICD-10-CM | POA: Diagnosis not present

## 2018-11-08 DIAGNOSIS — Z419 Encounter for procedure for purposes other than remedying health state, unspecified: Secondary | ICD-10-CM

## 2018-11-08 DIAGNOSIS — Z96652 Presence of left artificial knee joint: Secondary | ICD-10-CM | POA: Diagnosis not present

## 2018-11-08 DIAGNOSIS — Z981 Arthrodesis status: Secondary | ICD-10-CM | POA: Insufficient documentation

## 2018-11-08 DIAGNOSIS — Z6832 Body mass index (BMI) 32.0-32.9, adult: Secondary | ICD-10-CM | POA: Diagnosis not present

## 2018-11-08 HISTORY — DX: Spinal stenosis, lumbar region without neurogenic claudication: M48.061

## 2018-11-08 SURGERY — POSTERIOR LUMBAR FUSION 1 LEVEL
Anesthesia: General | Site: Back

## 2018-11-08 MED ORDER — OXYCODONE HCL 5 MG PO TABS
5.0000 mg | ORAL_TABLET | Freq: Once | ORAL | Status: AC | PRN
  Administered 2018-11-08: 5 mg via ORAL

## 2018-11-08 MED ORDER — LISINOPRIL 20 MG PO TABS
20.0000 mg | ORAL_TABLET | Freq: Every day | ORAL | Status: DC
  Administered 2018-11-09: 20 mg via ORAL
  Filled 2018-11-08 (×2): qty 1

## 2018-11-08 MED ORDER — LIDOCAINE-EPINEPHRINE 1 %-1:100000 IJ SOLN
INTRAMUSCULAR | Status: AC
  Filled 2018-11-08: qty 1

## 2018-11-08 MED ORDER — ONDANSETRON HCL 4 MG/2ML IJ SOLN
INTRAMUSCULAR | Status: DC | PRN
  Administered 2018-11-08: 4 mg via INTRAVENOUS

## 2018-11-08 MED ORDER — ACETAMINOPHEN 650 MG RE SUPP: 650.0000 mg | RECTAL | Status: DC | PRN

## 2018-11-08 MED ORDER — MIDAZOLAM HCL 2 MG/2ML IJ SOLN
INTRAMUSCULAR | Status: DC | PRN
  Administered 2018-11-08: 2 mg via INTRAVENOUS

## 2018-11-08 MED ORDER — PROPOFOL 10 MG/ML IV BOLUS
INTRAVENOUS | Status: DC | PRN
  Administered 2018-11-08: 200 mg via INTRAVENOUS

## 2018-11-08 MED ORDER — OXYCODONE HCL 5 MG PO TABS
10.0000 mg | ORAL_TABLET | ORAL | Status: DC | PRN
  Administered 2018-11-08 – 2018-11-09 (×4): 10 mg via ORAL
  Filled 2018-11-08 (×4): qty 2

## 2018-11-08 MED ORDER — PRAVASTATIN SODIUM 10 MG PO TABS
10.0000 mg | ORAL_TABLET | Freq: Every day | ORAL | Status: DC
  Administered 2018-11-08: 10 mg via ORAL
  Filled 2018-11-08: qty 1

## 2018-11-08 MED ORDER — LISINOPRIL-HYDROCHLOROTHIAZIDE 20-25 MG PO TABS: 1.0000 | ORAL_TABLET | Freq: Every day | ORAL | Status: DC

## 2018-11-08 MED ORDER — FENTANYL CITRATE (PF) 250 MCG/5ML IJ SOLN
INTRAMUSCULAR | Status: AC
  Filled 2018-11-08: qty 5

## 2018-11-08 MED ORDER — LIDOCAINE 2% (20 MG/ML) 5 ML SYRINGE
INTRAMUSCULAR | Status: DC | PRN
  Administered 2018-11-08: 100 mg via INTRAVENOUS

## 2018-11-08 MED ORDER — LACTATED RINGERS IV SOLN
INTRAVENOUS | Status: DC | PRN
  Administered 2018-11-08 (×2): via INTRAVENOUS

## 2018-11-08 MED ORDER — CYCLOBENZAPRINE HCL 10 MG PO TABS
ORAL_TABLET | ORAL | Status: AC
  Administered 2018-11-08: 10 mg via ORAL
  Filled 2018-11-08: qty 1

## 2018-11-08 MED ORDER — ROCURONIUM BROMIDE 10 MG/ML (PF) SYRINGE
PREFILLED_SYRINGE | INTRAVENOUS | Status: DC | PRN
  Administered 2018-11-08: 60 mg via INTRAVENOUS
  Administered 2018-11-08: 40 mg via INTRAVENOUS

## 2018-11-08 MED ORDER — FENTANYL CITRATE (PF) 100 MCG/2ML IJ SOLN
25.0000 ug | INTRAMUSCULAR | Status: DC | PRN
  Administered 2018-11-08 (×2): 25 ug via INTRAVENOUS

## 2018-11-08 MED ORDER — OXYCODONE HCL 5 MG/5ML PO SOLN: 5.0000 mg | Freq: Once | ORAL | Status: AC | PRN

## 2018-11-08 MED ORDER — THROMBIN 20000 UNITS EX SOLR
CUTANEOUS | Status: DC | PRN
  Administered 2018-11-08: 08:00:00

## 2018-11-08 MED ORDER — SODIUM CHLORIDE 0.9% FLUSH
3.0000 mL | Freq: Two times a day (BID) | INTRAVENOUS | Status: DC
  Administered 2018-11-08 (×2): 3 mL via INTRAVENOUS

## 2018-11-08 MED ORDER — CHLORHEXIDINE GLUCONATE CLOTH 2 % EX PADS: 6.0000 | MEDICATED_PAD | Freq: Once | CUTANEOUS | Status: DC

## 2018-11-08 MED ORDER — 0.9 % SODIUM CHLORIDE (POUR BTL) OPTIME
TOPICAL | Status: DC | PRN
  Administered 2018-11-08: 08:00:00 1000 mL

## 2018-11-08 MED ORDER — ALUM & MAG HYDROXIDE-SIMETH 200-200-20 MG/5ML PO SUSP: 30.0000 mL | Freq: Four times a day (QID) | ORAL | Status: DC | PRN

## 2018-11-08 MED ORDER — SUCCINYLCHOLINE CHLORIDE 200 MG/10ML IV SOSY
PREFILLED_SYRINGE | INTRAVENOUS | Status: DC | PRN
  Administered 2018-11-08: 120 mg via INTRAVENOUS

## 2018-11-08 MED ORDER — PROPOFOL 10 MG/ML IV BOLUS
INTRAVENOUS | Status: AC
  Filled 2018-11-08: qty 20

## 2018-11-08 MED ORDER — THROMBIN 20000 UNITS EX SOLR
CUTANEOUS | Status: AC
  Filled 2018-11-08: qty 20000

## 2018-11-08 MED ORDER — PANTOPRAZOLE SODIUM 40 MG IV SOLR
40.0000 mg | Freq: Every day | INTRAVENOUS | Status: DC
  Administered 2018-11-08: 40 mg via INTRAVENOUS
  Filled 2018-11-08: qty 40

## 2018-11-08 MED ORDER — CEFAZOLIN SODIUM-DEXTROSE 2-4 GM/100ML-% IV SOLN
2.0000 g | Freq: Three times a day (TID) | INTRAVENOUS | Status: AC
  Administered 2018-11-08 (×2): 2 g via INTRAVENOUS
  Filled 2018-11-08 (×3): qty 100

## 2018-11-08 MED ORDER — FENTANYL CITRATE (PF) 100 MCG/2ML IJ SOLN
INTRAMUSCULAR | Status: AC
  Filled 2018-11-08: qty 2

## 2018-11-08 MED ORDER — CEFAZOLIN SODIUM-DEXTROSE 2-4 GM/100ML-% IV SOLN
INTRAVENOUS | Status: AC
  Filled 2018-11-08: qty 100

## 2018-11-08 MED ORDER — ONDANSETRON HCL 4 MG/2ML IJ SOLN: 4.0000 mg | Freq: Four times a day (QID) | INTRAMUSCULAR | Status: DC | PRN

## 2018-11-08 MED ORDER — ACETAMINOPHEN 325 MG PO TABS: 650.0000 mg | ORAL_TABLET | ORAL | Status: DC | PRN

## 2018-11-08 MED ORDER — ONDANSETRON HCL 4 MG PO TABS: 4.0000 mg | ORAL_TABLET | Freq: Four times a day (QID) | ORAL | Status: DC | PRN

## 2018-11-08 MED ORDER — SODIUM CHLORIDE 0.9% FLUSH
3.0000 mL | INTRAVENOUS | Status: DC | PRN
  Administered 2018-11-08: 3 mL via INTRAVENOUS
  Filled 2018-11-08: qty 3

## 2018-11-08 MED ORDER — DEXAMETHASONE SODIUM PHOSPHATE 10 MG/ML IJ SOLN
10.0000 mg | INTRAMUSCULAR | Status: AC
  Administered 2018-11-08: 10 mg via INTRAVENOUS

## 2018-11-08 MED ORDER — HYDROCHLOROTHIAZIDE 25 MG PO TABS
25.0000 mg | ORAL_TABLET | Freq: Every day | ORAL | Status: DC
  Administered 2018-11-09: 25 mg via ORAL
  Filled 2018-11-08: qty 1

## 2018-11-08 MED ORDER — BUPIVACAINE LIPOSOME 1.3 % IJ SUSP
20.0000 mL | INTRAMUSCULAR | Status: AC
  Administered 2018-11-08: 20 mL
  Filled 2018-11-08: qty 20

## 2018-11-08 MED ORDER — PHENOL 1.4 % MT LIQD: 1.0000 | OROMUCOSAL | Status: DC | PRN

## 2018-11-08 MED ORDER — FENTANYL CITRATE (PF) 250 MCG/5ML IJ SOLN
INTRAMUSCULAR | Status: DC | PRN
  Administered 2018-11-08 (×6): 50 ug via INTRAVENOUS
  Administered 2018-11-08: 150 ug via INTRAVENOUS

## 2018-11-08 MED ORDER — TRAMADOL HCL 50 MG PO TABS: 50.0000 mg | ORAL_TABLET | Freq: Four times a day (QID) | ORAL | Status: DC | PRN

## 2018-11-08 MED ORDER — SODIUM CHLORIDE 0.9 % IV SOLN
250.0000 mL | INTRAVENOUS | Status: DC
  Administered 2018-11-08: 250 mL via INTRAVENOUS

## 2018-11-08 MED ORDER — DEXAMETHASONE SODIUM PHOSPHATE 10 MG/ML IJ SOLN
INTRAMUSCULAR | Status: AC
  Filled 2018-11-08: qty 1

## 2018-11-08 MED ORDER — ONDANSETRON HCL 4 MG/2ML IJ SOLN: 4.0000 mg | Freq: Once | INTRAMUSCULAR | Status: DC | PRN

## 2018-11-08 MED ORDER — LIDOCAINE-EPINEPHRINE 1 %-1:100000 IJ SOLN
INTRAMUSCULAR | Status: DC | PRN
  Administered 2018-11-08: 10 mL

## 2018-11-08 MED ORDER — HYDROMORPHONE HCL 1 MG/ML IJ SOLN
1.0000 mg | INTRAMUSCULAR | Status: DC | PRN
  Administered 2018-11-08 – 2018-11-09 (×3): 1 mg via INTRAVENOUS
  Filled 2018-11-08 (×3): qty 1

## 2018-11-08 MED ORDER — MENTHOL 3 MG MT LOZG: 1.0000 | LOZENGE | OROMUCOSAL | Status: DC | PRN

## 2018-11-08 MED ORDER — SODIUM CHLORIDE 0.9 % IV SOLN
INTRAVENOUS | Status: DC | PRN
  Administered 2018-11-08: 08:00:00 500 mL

## 2018-11-08 MED ORDER — CEFAZOLIN SODIUM-DEXTROSE 2-4 GM/100ML-% IV SOLN
2.0000 g | INTRAVENOUS | Status: AC
  Administered 2018-11-08: 2 g via INTRAVENOUS

## 2018-11-08 MED ORDER — THROMBIN 5000 UNITS EX SOLR
CUTANEOUS | Status: AC
  Filled 2018-11-08: qty 5000

## 2018-11-08 MED ORDER — CYCLOBENZAPRINE HCL 10 MG PO TABS
10.0000 mg | ORAL_TABLET | Freq: Three times a day (TID) | ORAL | Status: DC | PRN
  Administered 2018-11-08: 10 mg via ORAL
  Filled 2018-11-08: qty 1

## 2018-11-08 MED ORDER — SUGAMMADEX SODIUM 200 MG/2ML IV SOLN
INTRAVENOUS | Status: DC | PRN
  Administered 2018-11-08 (×2): 200 mg via INTRAVENOUS

## 2018-11-08 MED ORDER — OXYCODONE HCL 5 MG PO TABS
ORAL_TABLET | ORAL | Status: AC
  Filled 2018-11-08: qty 1

## 2018-11-08 MED ORDER — LITHIUM CARBONATE 300 MG PO CAPS
600.0000 mg | ORAL_CAPSULE | Freq: Two times a day (BID) | ORAL | Status: DC
  Administered 2018-11-08: 600 mg via ORAL
  Filled 2018-11-08 (×2): qty 2

## 2018-11-08 MED ORDER — PHENYLEPHRINE 40 MCG/ML (10ML) SYRINGE FOR IV PUSH (FOR BLOOD PRESSURE SUPPORT)
PREFILLED_SYRINGE | INTRAVENOUS | Status: DC | PRN
  Administered 2018-11-08 (×5): 80 ug via INTRAVENOUS

## 2018-11-08 MED ORDER — HALOPERIDOL LACTATE 2 MG/ML PO CONC
1.0000 mg | Freq: Every day | ORAL | Status: DC
  Filled 2018-11-08: qty 0.5

## 2018-11-08 MED ORDER — CYCLOBENZAPRINE HCL 10 MG PO TABS
10.0000 mg | ORAL_TABLET | Freq: Three times a day (TID) | ORAL | Status: DC
  Administered 2018-11-08 – 2018-11-09 (×3): 10 mg via ORAL
  Filled 2018-11-08 (×3): qty 1

## 2018-11-08 MED ORDER — MIDAZOLAM HCL 2 MG/2ML IJ SOLN
INTRAMUSCULAR | Status: AC
  Filled 2018-11-08: qty 2

## 2018-11-08 SURGICAL SUPPLY — 78 items
ADH SKN CLS APL DERMABOND .7 (GAUZE/BANDAGES/DRESSINGS) ×1
APL SKNCLS STERI-STRIP NONHPOA (GAUZE/BANDAGES/DRESSINGS) ×1
BAG DECANTER FOR FLEXI CONT (MISCELLANEOUS) ×2 IMPLANT
BENZOIN TINCTURE PRP APPL 2/3 (GAUZE/BANDAGES/DRESSINGS) ×2 IMPLANT
BLADE CLIPPER SURG (BLADE) IMPLANT
BLADE SURG 11 STRL SS (BLADE) ×2 IMPLANT
BONE VIVIGEN FORMABLE 10CC (Bone Implant) ×2 IMPLANT
BUR CUTTER 7.0 ROUND (BURR) ×2 IMPLANT
BUR MATCHSTICK NEURO 3.0 LAGG (BURR) ×2 IMPLANT
CANISTER SUCT 3000ML PPV (MISCELLANEOUS) ×2 IMPLANT
CAP LOCKING (Cap) ×4 IMPLANT
CAP LOCKING 5.5 CREO (Cap) IMPLANT
CARTRIDGE OIL MAESTRO DRILL (MISCELLANEOUS) ×1 IMPLANT
CONT SPEC 4OZ CLIKSEAL STRL BL (MISCELLANEOUS) ×2 IMPLANT
COVER BACK TABLE 60X90IN (DRAPES) ×2 IMPLANT
COVER WAND RF STERILE (DRAPES) ×1 IMPLANT
DECANTER SPIKE VIAL GLASS SM (MISCELLANEOUS) ×2 IMPLANT
DERMABOND ADVANCED (GAUZE/BANDAGES/DRESSINGS) ×1
DERMABOND ADVANCED .7 DNX12 (GAUZE/BANDAGES/DRESSINGS) ×1 IMPLANT
DIFFUSER DRILL AIR PNEUMATIC (MISCELLANEOUS) ×2 IMPLANT
DRAPE C-ARM 42X72 X-RAY (DRAPES) ×4 IMPLANT
DRAPE C-ARMOR (DRAPES) ×1 IMPLANT
DRAPE HALF SHEET 40X57 (DRAPES) IMPLANT
DRAPE LAPAROTOMY 100X72X124 (DRAPES) ×2 IMPLANT
DRAPE SURG 17X23 STRL (DRAPES) ×2 IMPLANT
DRSG OPSITE 4X5.5 SM (GAUZE/BANDAGES/DRESSINGS) ×2 IMPLANT
DRSG OPSITE POSTOP 4X6 (GAUZE/BANDAGES/DRESSINGS) ×2 IMPLANT
DRSG OPSITE POSTOP 4X8 (GAUZE/BANDAGES/DRESSINGS) ×1 IMPLANT
DURAPREP 26ML APPLICATOR (WOUND CARE) ×2 IMPLANT
ELECT REM PT RETURN 9FT ADLT (ELECTROSURGICAL) ×2
ELECTRODE REM PT RTRN 9FT ADLT (ELECTROSURGICAL) ×1 IMPLANT
EVACUATOR 3/16  PVC DRAIN (DRAIN) ×1
EVACUATOR 3/16 PVC DRAIN (DRAIN) IMPLANT
FIBER BOAT ALLOFUSE 15CC (Bone Implant) ×1 IMPLANT
GAUZE 4X4 16PLY RFD (DISPOSABLE) ×1 IMPLANT
GAUZE SPONGE 4X4 12PLY STRL (GAUZE/BANDAGES/DRESSINGS) ×2 IMPLANT
GLOVE BIO SURGEON STRL SZ7 (GLOVE) ×1 IMPLANT
GLOVE BIO SURGEON STRL SZ8 (GLOVE) ×4 IMPLANT
GLOVE BIOGEL PI IND STRL 7.0 (GLOVE) IMPLANT
GLOVE BIOGEL PI INDICATOR 7.0 (GLOVE) ×1
GLOVE ECLIPSE 7.5 STRL STRAW (GLOVE) IMPLANT
GLOVE INDICATOR 8.5 STRL (GLOVE) ×4 IMPLANT
GOWN STRL REUS W/ TWL LRG LVL3 (GOWN DISPOSABLE) IMPLANT
GOWN STRL REUS W/ TWL XL LVL3 (GOWN DISPOSABLE) ×2 IMPLANT
GOWN STRL REUS W/TWL 2XL LVL3 (GOWN DISPOSABLE) IMPLANT
GOWN STRL REUS W/TWL LRG LVL3 (GOWN DISPOSABLE) ×6
GOWN STRL REUS W/TWL XL LVL3 (GOWN DISPOSABLE) ×2
GRAFT BNE MATRIX VG FRMBL L 10 (Bone Implant) IMPLANT
HEMOSTAT POWDER KIT SURGIFOAM (HEMOSTASIS) IMPLANT
KIT BASIN OR (CUSTOM PROCEDURE TRAY) ×2 IMPLANT
KIT INFUSE SMALL (Orthopedic Implant) ×1 IMPLANT
KIT TURNOVER KIT B (KITS) ×2 IMPLANT
MILL MEDIUM DISP (BLADE) ×2 IMPLANT
NDL HYPO 21X1.5 SAFETY (NEEDLE) IMPLANT
NDL HYPO 25X1 1.5 SAFETY (NEEDLE) ×1 IMPLANT
NEEDLE HYPO 21X1.5 SAFETY (NEEDLE) ×2 IMPLANT
NEEDLE HYPO 25X1 1.5 SAFETY (NEEDLE) ×2 IMPLANT
NS IRRIG 1000ML POUR BTL (IV SOLUTION) ×2 IMPLANT
OIL CARTRIDGE MAESTRO DRILL (MISCELLANEOUS) ×2
PACK LAMINECTOMY NEURO (CUSTOM PROCEDURE TRAY) ×2 IMPLANT
PAD ARMBOARD 7.5X6 YLW CONV (MISCELLANEOUS) ×6 IMPLANT
ROD CREO 45MM SPINAL (Rod) ×2 IMPLANT
SCREW MOD 6.0-5.0X35MM (Screw) ×2 IMPLANT
SHAFT CREO 30MM (Neuro Prosthesis/Implant) ×2 IMPLANT
SPACER SUSTAIN RT 12 15D 9X26 (Spacer) ×2 IMPLANT
SPONGE LAP 4X18 RFD (DISPOSABLE) IMPLANT
SPONGE SURGIFOAM ABS GEL 100 (HEMOSTASIS) ×2 IMPLANT
STRIP CLOSURE SKIN 1/2X4 (GAUZE/BANDAGES/DRESSINGS) ×3 IMPLANT
SUT VIC AB 0 CT1 18XCR BRD8 (SUTURE) ×2 IMPLANT
SUT VIC AB 0 CT1 8-18 (SUTURE) ×4
SUT VIC AB 2-0 CT1 18 (SUTURE) ×2 IMPLANT
SUT VIC AB 4-0 PS2 27 (SUTURE) ×2 IMPLANT
SYRINGE 20CC LL (MISCELLANEOUS) ×1 IMPLANT
TOWEL GREEN STERILE (TOWEL DISPOSABLE) ×2 IMPLANT
TOWEL GREEN STERILE FF (TOWEL DISPOSABLE) ×2 IMPLANT
TRAY FOLEY MTR SLVR 16FR STAT (SET/KITS/TRAYS/PACK) ×2 IMPLANT
TULIP CREP AMP 5.5MM (Orthopedic Implant) ×4 IMPLANT
WATER STERILE IRR 1000ML POUR (IV SOLUTION) ×2 IMPLANT

## 2018-11-08 NOTE — Anesthesia Procedure Notes (Signed)
Procedure Name: Intubation Date/Time: 11/08/2018 7:41 AM Performed by: Elayne Snare, CRNA Pre-anesthesia Checklist: Patient identified, Emergency Drugs available, Suction available and Patient being monitored Patient Re-evaluated:Patient Re-evaluated prior to induction Oxygen Delivery Method: Circle System Utilized Preoxygenation: Pre-oxygenation with 100% oxygen Induction Type: IV induction and Rapid sequence Ventilation: Mask ventilation without difficulty Laryngoscope Size: Mac and 4 Grade View: Grade I Tube type: Oral Tube size: 7.5 mm Number of attempts: 1 Airway Equipment and Method: Stylet Placement Confirmation: ETT inserted through vocal cords under direct vision,  positive ETCO2 and breath sounds checked- equal and bilateral Secured at: 23 cm Tube secured with: Tape Dental Injury: Teeth and Oropharynx as per pre-operative assessment

## 2018-11-08 NOTE — Progress Notes (Signed)
Orthopedic Tech Progress Note Patient Details:  Peter [name] Dec 24, 1960 [id number] Patient has brace already Patient ID: Peter [name], male   DOB: 12/05/1960, 58 y.o.   MRN: [id number]   [name] 11/08/2018, 1:43 PM

## 2018-11-08 NOTE — Social Work (Signed)
CSW acknowledging consult for SNF placement. Will follow for therapy recommendations. Pt from home with spouse per Facesheet.   Peter [name], MSW, [facility] Clinical Social Work [phone]

## 2018-11-08 NOTE — Anesthesia Postprocedure Evaluation (Signed)
Anesthesia Post Note  Patient: Peter [name]  Procedure(s) Performed: PLIF - L2-L3 extension - Posterior Lateral - Interbody Fusion (N/A Back)     Patient location during evaluation: PACU Anesthesia Type: General Level of consciousness: awake and alert Pain management: pain level controlled Vital Signs Assessment: post-procedure vital signs reviewed and stable Respiratory status: spontaneous breathing, nonlabored ventilation and respiratory function stable Cardiovascular status: blood pressure returned to baseline and stable Postop Assessment: no apparent nausea or vomiting Anesthetic complications: no    Last Vitals:  Vitals:   11/08/18 1145 11/08/18 1200  BP: 138/83 136/77  Pulse: 69 69  Resp: 11 13  Temp: (!) 36.4 C   SpO2: 97% 99%    Last Pain:  Vitals:   11/08/18 1145  TempSrc:   PainSc: 6                  Beryle Lathe

## 2018-11-08 NOTE — Addendum Note (Signed)
Addendum  created 11/08/18 1255 by Janora Norlander, CRNA   Intraprocedure Meds edited

## 2018-11-08 NOTE — Op Note (Signed)
Preoperative diagnosis: L2-3 herniated nucleus pulposus instability back pain bilateral leg pain and possible pseudoarthrosis L3-4  Postoperative diagnosis: Same  Procedure: #1 exploration of fusion L3-4  2.  Decompressive lumbar laminectomy L2-3 with complete medial facetectomies and radical foraminotomies in excess and requiring more work than would be needed with a standard interbody fusion  3.  Posterior lumbar interbody fusion L2-3 utilizing the globus TPS coated insert and rotate cages 15 degree lordosis with a 12 mm cage.  4.  Cortical screw fixation L2-3 utilizing the globus Creo amp modular cortical screw set  5.  Posterior lateral arthrodesis L2-L4 utilizing locally harvested autograft mixed withvivigen and BMP  Surgeon: Jillyn Hidden Marita Burnsed  Assistant: Ervin Knack  Anesthesia: General  EBL: Minimal  HPI: Patient is a 58 year old gentleman previously undergone an L3-4 and L4-S1 fusion over multiple operations and developed progressive worsening back and bilateral hip and leg pain work-up revealed progressive degeneration collapse herniated disc at L2-3 with severe spinal stenosis.  Patient pain got progressively worse with escalating dose narcotic and progressive disability so we recommended decompression stabilization procedure at L2-3.  CT scan also showed possibility of a pseudoarthrosis at L3-4.  So extensively the risks and benefits of correction evaluation of his instability and stenosis at L2-3 as well as possible pseudoarthrosis at L3-4 and he understood and agreed to proceed forward as well as perioperative course expectations of outcome and alternatives of surgery.  Operative procedure: Patient brought into the ER was used in general seated position prone Wilson frame his back was prepped and draped in routine sterile fashion.  His old incision was opened up and extended cephalad subperiosteal dissection carried lamina of L1-L2-L3 and exposed the hardware down at starting at  L4.  He previously undergone anterior lateral interbody fusion at L3-4 so he had no posterior hardware at L3.  Exposed hardware at L4 the fusion at L3-4 did appear to be solid so I did not remove and remove the hardware at L4-5.  With the thoughts that I would potentially have to kind of that if it was pseudoarthrosis at 3 4.  I performed the decompression by removal of spinous process at L2 performed complete medial facetectomies radical from involving the L2 and L3 nerve roots bilaterally.  This significantly decompress the thecal sac which had severe hourglass compression.  Then incised the disc space cleaned out large herniated disc bilaterally then with sequential distraction with an 11 distractor in place I selected 12 mm insert and rotate cages 15 degrees lordosis 9 mm in width.  After adequate endplate preparation discectomy been achieved packed the cages with locally harvested autograft mixed packed extends amount of autograft centrally as well as BMP and inserted the contralateral cage as well.  Then I inserted more autograft laterally to the cages.  At this point I utilized fluoroscopy Steppel on the way for the interbody implants.  I then started the cortical screw placement pilot holes were drilled at L2 and L3 bilaterally then all cortical screw holes were drilled tapped and 6 oh/5 oh 30 mm screws were inserted at L2 60/50/30 5 mm screws were inserted at L3.  Reinspected the 3 4 space and it was not floridly unstable did not appear to have it was pseudoarthrosis.  However I did elect to lay some posterior lateral border bone down along the lamina and TPs from L2 down L4 just to augment it.  So decorticated aggressively the facets and TPs from L2-L4 packed locally harvested autograft mix along with a  BMP posterior laterally from L2 L4 assembled the heads advanced the screws postop fluoroscopy confirmed good position of all the implants.  Then took 45 mm rods I did compress to the L2-3 disc space  anchored everything in place reinspected the foramen foramen to confirm patency and no migration of graft material.  Gelfoam was opened up the dura the muscle fascia approximate layers with Vicryl skin was closed running 4 subcuticular Exparel had been injected in the fascia and the wound was dressed with Dermabond Dermabond benzoin Steri-Strips and sterile dressing.

## 2018-11-08 NOTE — Transfer of Care (Signed)
Immediate Anesthesia Transfer of Care Note  Patient: Peter [name]  Procedure(s) Performed: PLIF - L2-L3 extension - Posterior Lateral - Interbody Fusion (N/A Back)  Patient Location: PACU  Anesthesia Type:General  Level of Consciousness: awake, alert  and patient cooperative  Airway & Oxygen Therapy: Patient Spontanous Breathing and Patient connected to nasal cannula oxygen  Post-op Assessment: Report given to RN and Post -op Vital signs reviewed and stable  Post vital signs: Reviewed and stable  Last Vitals:  Vitals Value Taken Time  BP 144/86 11/08/2018 10:48 AM  Temp    Pulse 90 11/08/2018 10:50 AM  Resp 22 11/08/2018 10:50 AM  SpO2 95 % 11/08/2018 10:50 AM  Vitals shown include unvalidated device data.  Last Pain:  Vitals:   11/08/18 0553  TempSrc:   PainSc: 10-Worst pain ever      Patients Stated Pain Goal: 3 (11/08/18 0553)  Complications: No apparent anesthesia complications

## 2018-11-08 NOTE — H&P (Signed)
Peter [name] is an 58 y.o. male.   Chief Complaint: Back left greater than right leg pain HPI: 58 year old gentleman is a longstanding history with his back previously undergone an L3-4 through L4-S1 fusion over a couple different operations.  Over the last several weeks and months has had progressive worsening back pain is gotten severe to the point he is having escalating doses of pain medication becoming increasingly more difficult to control and has become progressively more disabled.  Work-up has revealed segmental degeneration above his previous fusion at L2-3.  He is got severe spinal stenosis and evidence of instability.  Due to his failed conservative treatment imaging findings and progression of clinical syndrome I recommended decompression stabilization procedure at L2-3.  Extensively gone over the risks and benefits of the procedure with him as well as perioperative course expectations of outcome and alternatives of surgery and he understood and agreed to proceed forward.  Past Medical History:  Diagnosis Date  . Anxiety   . Arthritis    lower back  . Bipolar disorder (HCC)   . Depression   . Hyperlipidemia   . Hypertension   . Wears dentures     Past Surgical History:  Procedure Laterality Date  . ANTERIOR CERVICAL DECOMP/DISCECTOMY FUSION  03/01/2012   Procedure: ANTERIOR CERVICAL DECOMPRESSION/DISCECTOMY FUSION 2 LEVELS;  Surgeon: Venita Lickahari Brooks, MD;  Location: [facility] OR;  Service: Orthopedics;  Laterality: N/A;  ACDF C5-7  . ANTERIOR LAT LUMBAR FUSION Left 12/20/2013   Procedure: ANTERIOR LATERAL LUMBAR FUSION 1 LEVEL;  Surgeon: Emilee HeroMark Leonard Dumonski, MD;  Location: [facility] OR;  Service: Orthopedics;  Laterality: Left;  Left lumbar 3-4 extreme left  lateral interbody fusion with instrumentation and allograft  . back fusion    . BACK SURGERY     x 2 - lower back  . JOINT REPLACEMENT     lt knee   acl  . LATERAL INTERBODY WITH INSTRUMENTATION L 3-4  12/20/2013  . WRIST SURGERY       History reviewed. No pertinent family history. Social History:  reports that he has been smoking cigarettes. He has a 30.00 pack-year smoking history. He has never used smokeless tobacco. He reports previous alcohol use. He reports that he does not use drugs.  Allergies:  Allergies  Allergen Reactions  . Duloxetine Hcl Shortness Of Breath and Rash  . Gabapentin Shortness Of Breath and Rash    UNSPECIFIED REACTION BUT WILL BE CAUTIOUS DUE TO PREGABALIN > SHORTNESS OF BREATH, RASH  . Lyrica [Pregabalin] Shortness Of Breath and Rash    Medications Prior to Admission  Medication Sig Dispense Refill  . cyclobenzaprine (FLEXERIL) 10 MG tablet Take 10 mg by mouth 3 (three) times daily.    . haloperidol (HALDOL) 2 MG/ML solution Take 1 mg by mouth at bedtime.    Marland Kitchen. lisinopril-hydrochlorothiazide (PRINZIDE,ZESTORETIC) 20-25 MG per tablet Take 1 tablet by mouth daily.    Marland Kitchen. lithium carbonate 300 MG capsule Take 600 mg by mouth 2 (two) times daily with a meal.    . lovastatin (MEVACOR) 20 MG tablet Take 20 mg by mouth at bedtime.    . traMADol (ULTRAM) 50 MG tablet Take 50 mg by mouth every 6 (six) hours as needed for moderate pain.    . diazepam (VALIUM) 5 MG tablet Take 1 tablet (5 mg total) by mouth every 6 (six) hours as needed for muscle spasms. (Patient not taking: Reported on 10/12/2018) 30 tablet 0    No results found for this or  any previous visit (from the past 48 hour(s)). No results found.  Review of Systems  Musculoskeletal: Positive for back pain, joint pain and myalgias.  Neurological: Positive for tingling and sensory change.    Blood pressure (!) 145/70, pulse 62, temperature 98 F (36.7 C), temperature source Oral, resp. rate 18, height 5\' 9"  (1.753 m), weight 99.8 kg, SpO2 99 %. Physical Exam  Constitutional: He is oriented to person, place, and time. He appears well-developed and well-nourished.  HENT:  Head: Normocephalic.  Eyes: Pupils are equal, round, and reactive  to light.  Neck: Normal range of motion.  Respiratory: Effort normal.  GI: Soft.  Neurological: He is alert and oriented to person, place, and time. He has normal strength. GCS eye subscore is 4. GCS verbal subscore is 5. GCS motor subscore is 6.  Patient is awake and alert strength is 5 out of 5 iliopsoas, quads, hamstrings, gastrocs, into tibialis, and EHL.  Skin: Skin is warm and dry.     Assessment/Plan 58 year old gentleman presents for L2-3 decompression stabilization procedure.  Nicoletta Hush P, MD 11/08/2018, 7:19 AM

## 2018-11-08 NOTE — Anesthesia Preprocedure Evaluation (Addendum)
Anesthesia Evaluation  Patient identified by MRN, date of birth, ID band Patient awake    Reviewed: Allergy & Precautions, NPO status , Patient's Chart, lab work & pertinent test results  History of Anesthesia Complications Negative for: history of anesthetic complications  Airway Mallampati: III  TM Distance: >3 FB Neck ROM: Full    Dental  (+) Edentulous Upper, Edentulous Lower   Pulmonary Current Smoker,    breath sounds clear to auscultation       Cardiovascular Exercise Tolerance: Good hypertension, Pt. on medications  Rhythm:Regular Rate:Normal     Neuro/Psych PSYCHIATRIC DISORDERS Anxiety Depression Bipolar Disorder  Periodic right leg tingling/numbness/weakness   Neuromuscular disease    GI/Hepatic negative GI ROS, Neg liver ROS,   Endo/Other   Obesity   Renal/GU negative Renal ROS     Musculoskeletal  (+) Arthritis ,   Abdominal   Peds  Hematology negative hematology ROS (+)   Anesthesia Other Findings Anaphylaxis to duloxetine and lyrica   Reproductive/Obstetrics                            Anesthesia Physical Anesthesia Plan  ASA: II  Anesthesia Plan: General   Post-op Pain Management:    Induction: Intravenous  PONV Risk Score and Plan: 2 and Treatment may vary due to age or medical condition, Ondansetron, Dexamethasone and Midazolam  Airway Management Planned: Oral ETT  Additional Equipment: None  Intra-op Plan:   Post-operative Plan: Extubation in OR  Informed Consent: I have reviewed the patients History and Physical, chart, labs and discussed the procedure including the risks, benefits and alternatives for the proposed anesthesia with the patient or authorized representative who has indicated his/her understanding and acceptance.     Dental advisory given  Plan Discussed with: CRNA and Anesthesiologist  Anesthesia Plan Comments:         Anesthesia Quick Evaluation

## 2018-11-08 NOTE — Plan of Care (Signed)
Pt. Progressing to full independence with minimal pain when managed with opioids

## 2018-11-09 DIAGNOSIS — M48061 Spinal stenosis, lumbar region without neurogenic claudication: Secondary | ICD-10-CM | POA: Diagnosis not present

## 2018-11-09 NOTE — Evaluation (Signed)
Physical Therapy Evaluation Patient Details Name: Peter [name] MRN: [id number] DOB: 09/04/1960 Today's Date: 11/09/2018   History of Present Illness  Pt is a 58 y.o. M with significant PMH of previous back surgeries, left knee replacement who is now s/p decompression stabilization procedure L2-3 and posterior lateral arthrodesis L2-4.  Clinical Impression  Patient evaluated by Physical Therapy with no further acute PT needs identified. Pt verbalizes good pain control. Pt is ambulating hallway distances independently and negotiated 10 steps without difficulty. Education re: spinal precautions, brace use, generalized exercise program. Pt needs reinforcement for maintaining spinal precautions. All education has been completed and the patient has no further questions. No follow-up Physical Therapy or equipment needs. PT is signing off. Thank you for this referral.     Follow Up Recommendations No PT follow up    Equipment Recommendations  None recommended by PT    Recommendations for Other Services       Precautions / Restrictions Precautions Precautions: Back Precaution Booklet Issued: Yes (comment) Precaution Comments: reviewed back precautions related to ADL and IADL Required Braces or Orthoses: Spinal Brace Spinal Brace: Lumbar corset;Applied in sitting position Restrictions Weight Bearing Restrictions: No      Mobility  Bed Mobility Overal bed mobility: Independent                Transfers Overall transfer level: Independent Equipment used: None                Ambulation/Gait Ambulation/Gait assistance: Independent Gait Distance (Feet): 200 Feet Assistive device: None Gait Pattern/deviations: WFL(Within Functional Limits)        Stairs Stairs: Yes Stairs assistance: Modified independent (Device/Increase time) Stair Management: One rail Right Number of Stairs: 10 General stair comments: step over step pattern  Wheelchair Mobility    Modified  Rankin (Stroke Patients Only)       Balance Overall balance assessment: No apparent balance deficits (not formally assessed)                                           Pertinent Vitals/Pain Pain Assessment: 0-10 Pain Score: 5  Pain Location: back Pain Descriptors / Indicators: Operative site guarding;Sore Pain Intervention(s): Monitored during session    Home Living Family/patient expects to be discharged to:: Private residence Living Arrangements: Spouse/significant other;Children Available Help at Discharge: Family Type of Home: House Home Access: Stairs to enter   Secretary/administratorntrance Stairs-Number of Steps: 5 Home Layout: One level Home Equipment: None      Prior Function Level of Independence: Independent         Comments: on disability     Hand Dominance   Dominant Hand: Right    Extremity/Trunk Assessment   Upper Extremity Assessment Upper Extremity Assessment: Overall WFL for tasks assessed    Lower Extremity Assessment Lower Extremity Assessment: Overall WFL for tasks assessed    Cervical / Trunk Assessment Cervical / Trunk Assessment: Other exceptions Cervical / Trunk Exceptions: s/p spinal sx  Communication   Communication: No difficulties  Cognition Arousal/Alertness: Awake/alert Behavior During Therapy: WFL for tasks assessed/performed Overall Cognitive Status: Within Functional Limits for tasks assessed                                 General Comments: needing reminders to generalize back precautions      General  Comments      Exercises     Assessment/Plan    PT Assessment Patent does not need any further PT services  PT Problem List         PT Treatment Interventions      PT Goals (Current goals can be found in the Care Plan section)  Acute Rehab PT Goals Patient Stated Goal: go home, resume fishing PT Goal Formulation: All assessment and education complete, DC therapy    Frequency     Barriers to  discharge        Co-evaluation               AM-PAC PT "6 Clicks" Mobility  Outcome Measure Help needed turning from your back to your side while in a flat bed without using bedrails?: None Help needed moving from lying on your back to sitting on the side of a flat bed without using bedrails?: None Help needed moving to and from a bed to a chair (including a wheelchair)?: None Help needed standing up from a chair using your arms (e.g., wheelchair or bedside chair)?: None Help needed to walk in hospital room?: None Help needed climbing 3-5 steps with a railing? : None 6 Click Score: 24    End of Session Equipment Utilized During Treatment: Back brace Activity Tolerance: Patient tolerated treatment well Patient left: with call bell/phone within reach;in chair Nurse Communication: Mobility status PT Visit Diagnosis: Pain;Difficulty in walking, not elsewhere classified (R26.2) Pain - part of body: (back)    Time: 5456-2563 PT Time Calculation (min) (ACUTE ONLY): 8 min   Charges:   PT Evaluation $PT Eval Low Complexity: 1 Low     Laurina Bustle, PT, DPT Acute Rehabilitation Services Pager [phone] Office [phone]   Vanetta Mulders 11/09/2018, 9:36 AM

## 2018-11-09 NOTE — Care Management CC44 (Signed)
Condition Code 44 Documentation Completed  Patient Details  Name: Peter [name] MRN: [id number] Date of Birth: 1960/07/16   Condition Code 44 given:  Yes Patient signature on Condition Code 44 notice:  Yes Documentation of 2 MD's agreement:  Yes Code 44 added to claim:  Yes    Glennon Mac, RN 11/09/2018, 9:54 AM

## 2018-11-09 NOTE — Care Management Obs Status (Signed)
MEDICARE OBSERVATION STATUS NOTIFICATION   Patient Details  Name: Peter [name] MRN: [id number] Date of Birth: [birth year]   Medicare Observation Status Notification Given:  Yes    Glennon Mac, RN 11/09/2018, 9:54 AM

## 2018-11-09 NOTE — Plan of Care (Signed)

## 2018-11-09 NOTE — Evaluation (Signed)
Occupational Therapy Evaluation and Discharge Patient Details Name: Peter [name] MRN: [id number] DOB: [birth year] Today's Date: 11/09/2018    History of Present Illness Pt is a 58 y.o. M with significant PMH of previous back surgeries, left knee replacement who is now s/p decompression stabilization procedure L2-3 and posterior lateral arthrodesis L2-4.   Clinical Impression   Pt is functioning modified independently in ADL and ADL transfers. All education completed with pt verbalizing understanding. No further OT needs.   Follow Up Recommendations  No OT follow up    Equipment Recommendations  None recommended by OT    Recommendations for Other Services       Precautions / Restrictions Precautions Precautions: Back Precaution Booklet Issued: Yes (comment) Precaution Comments: reviewed back precautions related to ADL and IADL Required Braces or Orthoses: Spinal Brace Spinal Brace: Lumbar corset;Applied in sitting position Restrictions Weight Bearing Restrictions: No      Mobility Bed Mobility               General bed mobility comments: pt standing upon arrival  Transfers Overall transfer level: Independent Equipment used: None                  Balance Overall balance assessment: No apparent balance deficits (not formally assessed)                                         ADL either performed or assessed with clinical judgement   ADL Overall ADL's : Modified independent                                       General ADL Comments: Educate pt in two cup method for toothbrushing, benefits of long handled bath sponge, to avoid twisting with wiping     Vision Patient Visual Report: No change from baseline       Perception     Praxis      Pertinent Vitals/Pain Pain Assessment: 0-10 Pain Score: 5  Pain Location: back Pain Descriptors / Indicators: Operative site guarding;Sore Pain Intervention(s): Monitored  during session     Hand Dominance Right   Extremity/Trunk Assessment Upper Extremity Assessment Upper Extremity Assessment: Overall WFL for tasks assessed   Lower Extremity Assessment Lower Extremity Assessment: Defer to PT evaluation       Communication Communication Communication: No difficulties   Cognition Arousal/Alertness: Awake/alert Behavior During Therapy: WFL for tasks assessed/performed Overall Cognitive Status: Within Functional Limits for tasks assessed                                 General Comments: needing reminders to generalize back precautions   General Comments       Exercises     Shoulder Instructions      Home Living Family/patient expects to be discharged to:: Private residence Living Arrangements: Spouse/significant other;Children Available Help at Discharge: Family Type of Home: House Home Access: Stairs to enter Secretary/administrator of Steps: 5   Home Layout: One level     Bathroom Shower/Tub: Producer, television/film/video: Standard     Home Equipment: None          Prior Functioning/Environment Level of Independence: Independent        Comments:  on disability        OT Problem List:        OT Treatment/Interventions:      OT Goals(Current goals can be found in the care plan section) Acute Rehab OT Goals Patient Stated Goal: go home, resume fishing  OT Frequency:     Barriers to D/C:            Co-evaluation              AM-PAC OT "6 Clicks" Daily Activity     Outcome Measure Help from another person eating meals?: None Help from another person taking care of personal grooming?: None Help from another person toileting, which includes using toliet, bedpan, or urinal?: None Help from another person bathing (including washing, rinsing, drying)?: None Help from another person to put on and taking off regular upper body clothing?: None Help from another person to put on and taking off  regular lower body clothing?: None 6 Click Score: 24   End of Session Equipment Utilized During Treatment: Back brace  Activity Tolerance: Patient tolerated treatment well Patient left: in chair;with call bell/phone within reach  OT Visit Diagnosis: Pain                Time: [id number] OT Time Calculation (min): 18 min Charges:  OT General Charges $OT Visit: 1 Visit OT Evaluation $OT Eval Low Complexity: 1 Low  Martie RoundJulie Alitzel Cookson, OTR/L Acute Rehabilitation Services Pager: [phone] Office: [phone]  Evern BioMayberry, Alton Bouknight Lynn 11/09/2018, 9:21 AM

## 2018-11-09 NOTE — Care Management Obs Status (Signed)
MEDICARE OBSERVATION STATUS NOTIFICATION   Patient Details  Name: Peter [name] MRN: [id number] Date of Birth: 06-08-1961   Medicare Observation Status Notification Given:  Yes    Glennon Mac, RN 11/09/2018, 9:55 AM

## 2018-11-09 NOTE — Discharge Summary (Signed)
  Physician Discharge Summary  Patient ID: Peter [name] MRN: [id number] DOB/AGE: 56/09/1960 56 y.o. Estimated body mass index is 32.49 kg/m as calculated from the following:   Height as of this encounter: 5\' 9"  (1.753 m).   Weight as of this encounter: 99.8 kg.   Admit date: 11/08/2018 Discharge date: 11/09/2018  Admission Diagnoses: Lumbar instability herniated nucleus pulposus and spinal stenosis L2-3  Discharge Diagnoses: Same Active Problems:   Spinal stenosis of lumbar region   Discharged Condition: good  Hospital Course: Patient admitted to hospital underwent decompression stabilization procedure L2-3 and posterior lateral arthrodesis L2-L4.  Postoperatively patient did very well and covering the floor on the floor was ambulating and voiding spontaneously tolerating regular diet and stable for discharge home.  Patient will be discharged with scheduled follow-up in 1 to 2 weeks.  Consults: Significant Diagnostic Studies: Treatments: L2-3 decompression fusion Discharge Exam: Blood pressure 135/83, pulse 90, temperature 98.4 F (36.9 C), temperature source Oral, resp. rate 18, height 5\' 9"  (1.753 m), weight 99.8 kg, SpO2 100 %. Strength 5-5 wound clean dry and intact  Disposition: Home   Allergies as of 11/09/2018      Reactions   Duloxetine Hcl Shortness Of Breath, Rash   Gabapentin Shortness Of Breath, Rash   UNSPECIFIED REACTION BUT WILL BE CAUTIOUS DUE TO PREGABALIN > SHORTNESS OF BREATH, RASH   Lyrica [pregabalin] Shortness Of Breath, Rash      Medication List    STOP taking these medications   diazepam 5 MG tablet Commonly known as:  VALIUM     TAKE these medications   cyclobenzaprine 10 MG tablet Commonly known as:  FLEXERIL Take 10 mg by mouth 3 (three) times daily.   haloperidol 2 MG/ML solution Commonly known as:  HALDOL Take 1 mg by mouth at bedtime.   lisinopril-hydrochlorothiazide 20-25 MG tablet Commonly known as:  ZESTORETIC Take 1 tablet  by mouth daily.   lithium carbonate 300 MG capsule Take 600 mg by mouth 2 (two) times daily with a meal.   lovastatin 20 MG tablet Commonly known as:  MEVACOR Take 20 mg by mouth at bedtime.   traMADol 50 MG tablet Commonly known as:  ULTRAM Take 50 mg by mouth every 6 (six) hours as needed for moderate pain.        Signed: Hovanes Hymas P 11/09/2018, 7:58 AM

## 2019-07-10 DIAGNOSIS — K449 Diaphragmatic hernia without obstruction or gangrene: Secondary | ICD-10-CM

## 2019-07-10 HISTORY — DX: Diaphragmatic hernia without obstruction or gangrene: K44.9

## 2019-12-05 DIAGNOSIS — S2231XA Fracture of one rib, right side, initial encounter for closed fracture: Secondary | ICD-10-CM

## 2019-12-05 HISTORY — DX: Fracture of one rib, right side, initial encounter for closed fracture: S22.31XA

## 2019-12-14 DIAGNOSIS — F17200 Nicotine dependence, unspecified, uncomplicated: Secondary | ICD-10-CM

## 2019-12-14 HISTORY — DX: Nicotine dependence, unspecified, uncomplicated: F17.200

## 2020-09-18 DIAGNOSIS — R0602 Shortness of breath: Secondary | ICD-10-CM | POA: Diagnosis not present

## 2020-09-26 ENCOUNTER — Encounter: Payer: Self-pay | Admitting: *Deleted

## 2020-09-26 ENCOUNTER — Encounter: Payer: Self-pay | Admitting: Cardiology

## 2020-09-26 DIAGNOSIS — F319 Bipolar disorder, unspecified: Secondary | ICD-10-CM | POA: Insufficient documentation

## 2020-09-26 DIAGNOSIS — M1990 Unspecified osteoarthritis, unspecified site: Secondary | ICD-10-CM | POA: Insufficient documentation

## 2020-09-26 DIAGNOSIS — I10 Essential (primary) hypertension: Secondary | ICD-10-CM | POA: Insufficient documentation

## 2020-09-26 DIAGNOSIS — E785 Hyperlipidemia, unspecified: Secondary | ICD-10-CM | POA: Insufficient documentation

## 2020-10-06 DIAGNOSIS — E6609 Other obesity due to excess calories: Secondary | ICD-10-CM | POA: Insufficient documentation

## 2020-10-06 DIAGNOSIS — F419 Anxiety disorder, unspecified: Secondary | ICD-10-CM | POA: Insufficient documentation

## 2020-10-06 DIAGNOSIS — N529 Male erectile dysfunction, unspecified: Secondary | ICD-10-CM | POA: Insufficient documentation

## 2020-10-06 DIAGNOSIS — F32A Depression, unspecified: Secondary | ICD-10-CM | POA: Insufficient documentation

## 2020-10-06 DIAGNOSIS — M5440 Lumbago with sciatica, unspecified side: Secondary | ICD-10-CM | POA: Insufficient documentation

## 2020-10-06 DIAGNOSIS — Z972 Presence of dental prosthetic device (complete) (partial): Secondary | ICD-10-CM | POA: Insufficient documentation

## 2020-10-06 DIAGNOSIS — I10 Essential (primary) hypertension: Secondary | ICD-10-CM | POA: Insufficient documentation

## 2020-10-07 ENCOUNTER — Encounter: Payer: Self-pay | Admitting: Cardiology

## 2020-10-07 ENCOUNTER — Ambulatory Visit (INDEPENDENT_AMBULATORY_CARE_PROVIDER_SITE_OTHER): Payer: Medicare Other | Admitting: Cardiology

## 2020-10-07 ENCOUNTER — Other Ambulatory Visit: Payer: Self-pay

## 2020-10-07 VITALS — BP 146/70 | HR 68 | Ht 70.0 in | Wt 253.0 lb

## 2020-10-07 DIAGNOSIS — R931 Abnormal findings on diagnostic imaging of heart and coronary circulation: Secondary | ICD-10-CM | POA: Diagnosis not present

## 2020-10-07 DIAGNOSIS — R0609 Other forms of dyspnea: Secondary | ICD-10-CM

## 2020-10-07 DIAGNOSIS — R0600 Dyspnea, unspecified: Secondary | ICD-10-CM

## 2020-10-07 DIAGNOSIS — I119 Hypertensive heart disease without heart failure: Secondary | ICD-10-CM | POA: Diagnosis not present

## 2020-10-07 DIAGNOSIS — I10 Essential (primary) hypertension: Secondary | ICD-10-CM

## 2020-10-07 MED ORDER — METOPROLOL TARTRATE 100 MG PO TABS: 100.0000 mg | ORAL_TABLET | Freq: Once | ORAL | 0 refills | Status: DC

## 2020-10-07 NOTE — Progress Notes (Signed)
Cardiology Office Note:    Date:  10/07/2020   ID:  Peter [name], DOB 1961/03/24, MRN [id number]  PCP:  Patient, No Pcp Per (Inactive)  Cardiologist:  No primary care provider on file.  Electrophysiologist:  None   Referring MD: Adela Glimpse, NP   I have been short of breath  History of Present Illness:    Peter [name] is a 60 y.o. male with a hx of hypertension, hyperlipidemia, current smoker, morbid obesity, recently diagnosed DVT is currently on Eliquis which is being managed by his primary care doctor who is here today at the request of his PCP to be evaluated for shortness of breath.  The patient tells me that he had had previous surgery for his hernia repair and over the last few months he has been experiencing some abdominal pain a result of this.  He is planned to have surgery in May.  But the problem he has been experiencing shortness of breath on exertion.  He denies any chest pain but on minimal exertion he gets short of breath.  Past Medical History:  Diagnosis Date  . Anxiety   . Arthritis    lower back  . Bipolar disorder (HCC)   . Cervical radiculopathy 02/22/2012  . Closed fracture of one rib of right side 12/05/2019   Formatting of this note might be different from the original. Added automatically from request for surgery [id number]  . Depression   . Diaphragmatic hernia without obstruction and without gangrene 07/10/2019   Formatting of this note might be different from the original. Added automatically from request for surgery [id number]  . ED (erectile dysfunction)   . Essential (primary) hypertension   . Hyperlipidemia   . Lumbago with sciatica    Both sides  . Obesity due to excess calories   . Radiculopathy 12/20/2013  . Spinal stenosis of lumbar region 11/08/2018  . Tobacco use disorder 12/14/2019  . Wears dentures     Past Surgical History:  Procedure Laterality Date  . ANTERIOR CERVICAL DECOMP/DISCECTOMY FUSION  03/01/2012   Procedure: ANTERIOR  CERVICAL DECOMPRESSION/DISCECTOMY FUSION 2 LEVELS;  Surgeon: Venita Lick, MD;  Location: [facility] OR;  Service: Orthopedics;  Laterality: N/A;  ACDF C5-7  . ANTERIOR LAT LUMBAR FUSION Left 12/20/2013   Procedure: ANTERIOR LATERAL LUMBAR FUSION 1 LEVEL;  Surgeon: Emilee Hero, MD;  Location: [facility] OR;  Service: Orthopedics;  Laterality: Left;  Left lumbar 3-4 extreme left  lateral interbody fusion with instrumentation and allograft  . back fusion    . BACK SURGERY     x 2 - lower back  . JOINT REPLACEMENT     lt knee   acl  . LATERAL INTERBODY WITH INSTRUMENTATION L 3-4  12/20/2013  . WRIST SURGERY      Current Medications: Current Meds  Medication Sig  . albuterol (PROVENTIL) (2.5 MG/3ML) 0.083% nebulizer solution Take 2.5 mg by nebulization every 6 (six) hours as needed for wheezing or shortness of breath.  Marland Kitchen albuterol (VENTOLIN HFA) 108 (90 Base) MCG/ACT inhaler Inhale 2 puffs into the lungs every 6 (six) hours as needed for wheezing or shortness of breath.  Marland Kitchen apixaban (ELIQUIS) 5 MG TABS tablet Take 1 tablet by mouth daily.  . budesonide-formoterol (SYMBICORT) 160-4.5 MCG/ACT inhaler Inhale 2 puffs into the lungs daily.  . Cholecalciferol 1.25 MG (50000 UT) capsule Take 1 capsule by mouth once a week.  . haloperidol (HALDOL) 2 MG/ML solution Take 1 mg by mouth at bedtime.  Marland Kitchen lisinopril-hydrochlorothiazide (  PRINZIDE,ZESTORETIC) 20-25 MG per tablet Take 1 tablet by mouth daily.  Marland Kitchen LITHIUM CARBONATE PO Take 300 mg by mouth 2 (two) times daily.  Marland Kitchen lovastatin (MEVACOR) 20 MG tablet Take 20 mg by mouth at bedtime.  . metoprolol tartrate (LOPRESSOR) 100 MG tablet Take 1 tablet (100 mg total) by mouth once for 1 dose. Take two hours prior to your cardiac CT  . sildenafil (VIAGRA) 100 MG tablet Take 100 mg by mouth daily as needed for erectile dysfunction.     Allergies:   Duloxetine, Duloxetine hcl, Gabapentin, and Lyrica [pregabalin]   Social History   Socioeconomic History  . Marital  status: Married    Spouse name: Not on file  . Number of children: Not on file  . Years of education: Not on file  . Highest education level: Not on file  Occupational History  . Not on file  Tobacco Use  . Smoking status: Current Every Day Smoker    Packs/day: 1.00    Years: 30.00    Pack years: 30.00    Types: Cigarettes  . Smokeless tobacco: Never Used  . Tobacco comment: quit 07/2018 but sometimes smokes  Vaping Use  . Vaping Use: Never used  Substance and Sexual Activity  . Alcohol use: Not Currently  . Drug use: No  . Sexual activity: Yes  Other Topics Concern  . Not on file  Social History Narrative  . Not on file   Social Determinants of Health   Financial Resource Strain: Not on file  Food Insecurity: Not on file  Transportation Needs: Not on file  Physical Activity: Not on file  Stress: Not on file  Social Connections: Not on file     Family History: The patient's family history includes Diabetes in his father; Heart disease in his father and mother; Hypertension in his father and mother.  ROS:   Review of Systems  Constitution: Negative for decreased appetite, fever and weight gain.  HENT: Negative for congestion, ear discharge, hoarse voice and sore throat.   Eyes: Negative for discharge, redness, vision loss in right eye and visual halos.  Cardiovascular: Negative for chest pain, dyspnea on exertion, leg swelling, orthopnea and palpitations.  Respiratory: Negative for cough, hemoptysis, shortness of breath and snoring.   Endocrine: Negative for heat intolerance and polyphagia.  Hematologic/Lymphatic: Negative for bleeding problem. Does not bruise/bleed easily.  Skin: Negative for flushing, nail changes, rash and suspicious lesions.  Musculoskeletal: Negative for arthritis, joint pain, muscle cramps, myalgias, neck pain and stiffness.  Gastrointestinal: Negative for abdominal pain, bowel incontinence, diarrhea and excessive appetite.  Genitourinary:  Negative for decreased libido, genital sores and incomplete emptying.  Neurological: Negative for brief paralysis, focal weakness, headaches and loss of balance.  Psychiatric/Behavioral: Negative for altered mental status, depression and suicidal ideas.  Allergic/Immunologic: Negative for HIV exposure and persistent infections.    EKGs/Labs/Other Studies Reviewed:    The following studies were reviewed today:   EKG:  The ekg ordered today demonstrates sinus rhythm, heart rate 60 bpm.  Echocardiogram done in March 2022 showed normal LVEF.  Due to technical difficulty of the study regional wall motion abnormality could not be commented on.  Right ventricle sounds were normal.  Grade 1 diastolic dysfunction.  Left atrial size was normal.  Right atrium was not well visualized.  Aortic valve was not well visualized.  Recent Labs: No results found for requested labs within last 8760 hours.  Recent Lipid Panel No results found for: CHOL, TRIG, HDL,  CHOLHDL, VLDL, LDLCALC, LDLDIRECT  Physical Exam:    VS:  BP (!) 146/70 (BP Location: Left Arm)   Pulse 68   Ht 5\' 10"  (1.778 m)   Wt 253 lb (114.8 kg)   SpO2 97%   BMI 36.30 kg/m     Wt Readings from Last 3 Encounters:  10/07/20 253 lb (114.8 kg)  09/23/20 253 lb (114.8 kg)  11/08/18 220 lb (99.8 kg)     GEN: Well nourished, well developed in no acute distress HEENT: Normal NECK: No JVD; No carotid bruits LYMPHATICS: No lymphadenopathy CARDIAC: S1S2 noted,RRR, no murmurs, rubs, gallops RESPIRATORY:  Clear to auscultation without rales, wheezing or rhonchi  ABDOMEN: Soft, non-tender, non-distended, +bowel sounds, no guarding. EXTREMITIES: No edema, No cyanosis, no clubbing MUSCULOSKELETAL:  No deformity  SKIN: Warm and dry NEUROLOGIC:  Alert and oriented x 3, non-focal PSYCHIATRIC:  Normal affect, good insight  ASSESSMENT:    1. Essential (primary) hypertension   2. Dyspnea on exertion   3. Hypertensive heart disease without  heart failure    4. Abnormal findings on diagnostic imaging of heart and coronary circulation     PLAN:     He is experiencing shortness of breath on exertion which she has concerning patient does have intermediate risk for coronary artery disease would not like to do is pursue an ischemic evaluation in this patient.  Shared decision a coronary CTA at this time is appropriate.  I have discussed with the patient about the testing.  The patient has no IV contrast allergy and is agreeable to proceed with this test.  He is slightly hypertensive today but he tells me he is in pain that is why he usually is in the 120s.  We will continue his current medication regimen.  The patient understands the need to lose weight with diet and exercise. We have discussed specific strategies for this.  Smoking cessation advised  The patient is in agreement with the above plan. The patient left the office in stable condition.  The patient will follow up in 3 months or sooner if needed.   Medication Adjustments/Labs and Tests Ordered: Current medicines are reviewed at length with the patient today.  Concerns regarding medicines are outlined above.  Orders Placed This Encounter  Procedures  . CT CORONARY MORPH W/CTA COR W/SCORE W/CA W/CM &/OR WO/CM  . CT CORONARY FRACTIONAL FLOW RESERVE DATA PREP  . CT CORONARY FRACTIONAL FLOW RESERVE FLUID ANALYSIS  . Basic metabolic panel  . EKG 12-Lead   Meds ordered this encounter  Medications  . metoprolol tartrate (LOPRESSOR) 100 MG tablet    Sig: Take 1 tablet (100 mg total) by mouth once for 1 dose. Take two hours prior to your cardiac CT    Dispense:  1 tablet    Refill:  0    Patient Instructions  Medication Instructions:  Your physician recommends that you continue on your current medications as directed. Please refer to the Current Medication list given to you today.   *If you need a refill on your cardiac medications before your next appointment, please  call your pharmacy*   Lab Work: Your physician recommends that you return for lab work in: Within one week of your cardiac CT  BMP  If you have labs (blood work) drawn today and your tests are completely normal, you will receive your results only by: 11/10/18 MyChart Message (if you have MyChart) OR . A paper copy in the mail If you have any lab test  that is abnormal or we need to change your treatment, we will call you to review the results.   Testing/Procedures: Your cardiac CT will be scheduled at  the below location:   [facility] [address] [place], [place] [address] [phone]  If scheduled at [facility], please arrive at the [facility] main entrance (entrance A) of [facility] 30 minutes prior to test start time. Proceed to the [facility] Radiology Department (first floor) to check-in and test prep.  Please follow these instructions carefully (unless otherwise directed):  Hold all erectile dysfunction medications at least 3 days (72 hrs) prior to test.  On the Night Before the Test: . Be sure to Drink plenty of water. . Do not consume any caffeinated/decaffeinated beverages or chocolate 12 hours prior to your test. . Do not take any antihistamines 12 hours prior to your test.  On the Day of the Test: . Drink plenty of water until 1 hour prior to the test. . Do not eat any food 4 hours prior to the test. . You may take your regular medications prior to the test.  . Take metoprolol (Lopressor) two hours prior to test. . HOLD Hydrochlorothiazide morning of the test.      After the Test: . Drink plenty of water. . After receiving IV contrast, you may experience a mild flushed feeling. This is normal. . On occasion, you may experience a mild rash up to 24 hours after the test. This is not dangerous. If this occurs, you can take Benadryl 25 mg and increase your fluid intake. . If you experience trouble breathing, this can be serious. If it  is severe call 911 IMMEDIATELY. If it is mild, please call our office. . If you take any of these medications: Glipizide/Metformin, Avandament, Glucavance, please do not take 48 hours after completing test unless otherwise instructed.   Once we have confirmed authorization from your insurance company, we will call you to set up a date and time for your test. Based on how quickly your insurance processes prior authorizations requests, please allow up to 4 weeks to be contacted for scheduling your Cardiac CT appointment. Be advised that routine Cardiac CT appointments could be scheduled as many as 8 weeks after your provider has ordered it.  For non-scheduling related questions, please contact the cardiac imaging nurse navigator should you have any questions/concerns: Rockwell Alexandria, Cardiac Imaging Nurse Navigator Larey Brick, Cardiac Imaging Nurse Navigator [facility] Direct Office Dial: [phone]   For scheduling needs, including cancellations and rescheduling, please call [place], [phone].     Follow-Up: At [facility], you and your health needs are our priority.  As part of our continuing mission to provide you with exceptional heart care, we have created designated Provider Care Teams.  These Care Teams include your primary Cardiologist (physician) and Advanced Practice Providers (APPs -  Physician Assistants and Nurse Practitioners) who all work together to provide you with the care you need, when you need it.  We recommend signing up for the patient portal called "MyChart".  Sign up information is provided on this After Visit Summary.  MyChart is used to connect with patients for Virtual Visits (Telemedicine).  Patients are able to view lab/test results, encounter notes, upcoming appointments, etc.  Non-urgent messages can be sent to your provider as well.   To learn more about what you can do with MyChart, go to [email or web address].    Your  next  appointment:   3 month(s)  The format for your next appointment:   In Person  Provider:   Thomasene RippleKardie Dajanae Brophy, DO   Other Instructions      Adopting a Healthy Lifestyle.  Know what a healthy weight is for you (roughly BMI <25) and aim to maintain this   Aim for 7+ servings of fruits and vegetables daily   65-80+ fluid ounces of water or unsweet tea for healthy kidneys   Limit to max 1 drink of alcohol per day; avoid smoking/tobacco   Limit animal fats in diet for cholesterol and heart health - choose grass fed whenever available   Avoid highly processed foods, and foods high in saturated/trans fats   Aim for low stress - take time to unwind and care for your mental health   Aim for 150 min of moderate intensity exercise weekly for heart health, and weights twice weekly for bone health   Aim for 7-9 hours of sleep daily   When it comes to diets, agreement about the perfect plan isnt easy to find, even among the experts. Experts at the [employer] developed an idea known as the Healthy Eating Plate. Just imagine a plate divided into logical, healthy portions.   The emphasis is on diet quality:   Load up on vegetables and fruits - one-half of your plate: Aim for color and variety, and remember that potatoes dont count.   Go for whole grains - one-quarter of your plate: Whole wheat, barley, wheat berries, quinoa, oats, brown rice, and foods made with them. If you want pasta, go with whole wheat pasta.   Protein power - one-quarter of your plate: Fish, chicken, beans, and nuts are all healthy, versatile protein sources. Limit red meat.   The diet, however, does go beyond the plate, offering a few other suggestions.   Use healthy plant oils, such as olive, canola, soy, corn, sunflower and peanut. Check the labels, and avoid partially hydrogenated oil, which have unhealthy trans fats.   If youre thirsty, drink water. Coffee and tea are good in  moderation, but skip sugary drinks and limit milk and dairy products to one or two daily servings.   The type of carbohydrate in the diet is more important than the amount. Some sources of carbohydrates, such as vegetables, fruits, whole grains, and beans-are healthier than others.   Finally, stay active  Signed, Thomasene RippleKardie Amrom Ore, DO  10/07/2020 4:25 PM    [facility]

## 2020-10-07 NOTE — Patient Instructions (Signed)
Medication Instructions:  Your physician recommends that you continue on your current medications as directed. Please refer to the Current Medication list given to you today.   *If you need a refill on your cardiac medications before your next appointment, please call your pharmacy*   Lab Work: Your physician recommends that you return for lab work in: Within one week of your cardiac CT  BMP  If you have labs (blood work) drawn today and your tests are completely normal, you will receive your results only by: Marland Kitchen MyChart Message (if you have MyChart) OR . A paper copy in the mail If you have any lab test that is abnormal or we need to change your treatment, we will call you to review the results.   Testing/Procedures: Your cardiac CT will be scheduled at  the below location:   [facility] [address] [place], [place] [address] [phone]  If scheduled at [facility], please arrive at the [facility] main entrance (entrance A) of [facility] 30 minutes prior to test start time. Proceed to the [facility] Radiology Department (first floor) to check-in and test prep.  Please follow these instructions carefully (unless otherwise directed):  Hold all erectile dysfunction medications at least 3 days (72 hrs) prior to test.  On the Night Before the Test: . Be sure to Drink plenty of water. . Do not consume any caffeinated/decaffeinated beverages or chocolate 12 hours prior to your test. . Do not take any antihistamines 12 hours prior to your test.  On the Day of the Test: . Drink plenty of water until 1 hour prior to the test. . Do not eat any food 4 hours prior to the test. . You may take your regular medications prior to the test.  . Take metoprolol (Lopressor) two hours prior to test. . HOLD Hydrochlorothiazide morning of the test.      After the Test: . Drink plenty of water. . After receiving IV contrast, you may experience a mild flushed  feeling. This is normal. . On occasion, you may experience a mild rash up to 24 hours after the test. This is not dangerous. If this occurs, you can take Benadryl 25 mg and increase your fluid intake. . If you experience trouble breathing, this can be serious. If it is severe call 911 IMMEDIATELY. If it is mild, please call our office. . If you take any of these medications: Glipizide/Metformin, Avandament, Glucavance, please do not take 48 hours after completing test unless otherwise instructed.   Once we have confirmed authorization from your insurance company, we will call you to set up a date and time for your test. Based on how quickly your insurance processes prior authorizations requests, please allow up to 4 weeks to be contacted for scheduling your Cardiac CT appointment. Be advised that routine Cardiac CT appointments could be scheduled as many as 8 weeks after your provider has ordered it.  For non-scheduling related questions, please contact the cardiac imaging nurse navigator should you have any questions/concerns: Rockwell Alexandria, Cardiac Imaging Nurse Navigator Larey Brick, Cardiac Imaging Nurse Navigator [facility] Direct Office Dial: [phone]   For scheduling needs, including cancellations and rescheduling, please call [place], [phone].     Follow-Up: At [facility], you and your health needs are our priority.  As part of our continuing mission to provide you with exceptional heart care, we have created designated Provider Care Teams.  These Care Teams include  your primary Cardiologist (physician) and Advanced Practice Providers (APPs -  Physician Assistants and Nurse Practitioners) who all work together to provide you with the care you need, when you need it.  We recommend signing up for the patient portal called "MyChart".  Sign up information is provided on this After Visit Summary.  MyChart is used to connect with patients for  Virtual Visits (Telemedicine).  Patients are able to view lab/test results, encounter notes, upcoming appointments, etc.  Non-urgent messages can be sent to your provider as well.   To learn more about what you can do with MyChart, go to [email or web address].    Your next appointment:   3 month(s)  The format for your next appointment:   In Person  Provider:   Thomasene Ripple, DO   Other Instructions

## 2020-10-14 ENCOUNTER — Telehealth (HOSPITAL_COMMUNITY): Payer: Self-pay | Admitting: *Deleted

## 2020-10-14 NOTE — Telephone Encounter (Signed)
Reaching out to patient to offer assistance regarding upcoming cardiac imaging study; pt verbalizes understanding of appt date/time, parking situation and where to check in, pre-test NPO status and medications ordered, and verified current allergies; name and call back number provided for further questions should they arise  Larey Brick RN Navigator Cardiac Imaging [facility] [phone] office [phone] cell  Pt to take 100mg  metoprolol 2 hours prior to scan.

## 2020-10-16 ENCOUNTER — Ambulatory Visit (HOSPITAL_COMMUNITY)
Admission: RE | Admit: 2020-10-16 | Discharge: 2020-10-16 | Disposition: A | Discharge status: Home / Self Care | Payer: Medicare Other | Source: Ambulatory Visit | Attending: Cardiology | Admitting: Cardiology

## 2020-10-16 ENCOUNTER — Telehealth: Payer: Self-pay

## 2020-10-16 ENCOUNTER — Other Ambulatory Visit: Payer: Self-pay

## 2020-10-16 ENCOUNTER — Telehealth: Payer: Self-pay | Admitting: Cardiology

## 2020-10-16 ENCOUNTER — Encounter: Payer: Medicare Other | Admitting: *Deleted

## 2020-10-16 ENCOUNTER — Observation Stay (HOSPITAL_COMMUNITY)
Admission: EM | Admit: 2020-10-16 | Discharge: 2020-10-17 | Disposition: A | Discharge status: Home / Self Care | Payer: Medicare Other | Attending: Family Medicine | Admitting: Family Medicine

## 2020-10-16 ENCOUNTER — Encounter (HOSPITAL_COMMUNITY): Payer: Self-pay | Admitting: Family Medicine

## 2020-10-16 ENCOUNTER — Emergency Department (HOSPITAL_COMMUNITY): Payer: Medicare Other

## 2020-10-16 DIAGNOSIS — J45909 Unspecified asthma, uncomplicated: Secondary | ICD-10-CM | POA: Diagnosis not present

## 2020-10-16 DIAGNOSIS — R911 Solitary pulmonary nodule: Secondary | ICD-10-CM | POA: Diagnosis not present

## 2020-10-16 DIAGNOSIS — R0602 Shortness of breath: Secondary | ICD-10-CM | POA: Diagnosis present

## 2020-10-16 DIAGNOSIS — I10 Essential (primary) hypertension: Secondary | ICD-10-CM | POA: Insufficient documentation

## 2020-10-16 DIAGNOSIS — R0600 Dyspnea, unspecified: Secondary | ICD-10-CM | POA: Insufficient documentation

## 2020-10-16 DIAGNOSIS — Z7901 Long term (current) use of anticoagulants: Secondary | ICD-10-CM | POA: Insufficient documentation

## 2020-10-16 DIAGNOSIS — I82409 Acute embolism and thrombosis of unspecified deep veins of unspecified lower extremity: Secondary | ICD-10-CM | POA: Insufficient documentation

## 2020-10-16 DIAGNOSIS — Z79899 Other long term (current) drug therapy: Secondary | ICD-10-CM | POA: Diagnosis not present

## 2020-10-16 DIAGNOSIS — I119 Hypertensive heart disease without heart failure: Secondary | ICD-10-CM | POA: Insufficient documentation

## 2020-10-16 DIAGNOSIS — Z20822 Contact with and (suspected) exposure to covid-19: Secondary | ICD-10-CM | POA: Insufficient documentation

## 2020-10-16 DIAGNOSIS — F5221 Male erectile disorder: Secondary | ICD-10-CM | POA: Insufficient documentation

## 2020-10-16 DIAGNOSIS — F319 Bipolar disorder, unspecified: Secondary | ICD-10-CM | POA: Insufficient documentation

## 2020-10-16 DIAGNOSIS — R931 Abnormal findings on diagnostic imaging of heart and coronary circulation: Secondary | ICD-10-CM

## 2020-10-16 DIAGNOSIS — I2699 Other pulmonary embolism without acute cor pulmonale: Secondary | ICD-10-CM | POA: Diagnosis present

## 2020-10-16 DIAGNOSIS — Z006 Encounter for examination for normal comparison and control in clinical research program: Secondary | ICD-10-CM

## 2020-10-16 DIAGNOSIS — K760 Fatty (change of) liver, not elsewhere classified: Secondary | ICD-10-CM | POA: Diagnosis not present

## 2020-10-16 DIAGNOSIS — F17210 Nicotine dependence, cigarettes, uncomplicated: Secondary | ICD-10-CM | POA: Diagnosis not present

## 2020-10-16 DIAGNOSIS — I2693 Single subsegmental pulmonary embolism without acute cor pulmonale: Secondary | ICD-10-CM | POA: Diagnosis not present

## 2020-10-16 LAB — CBC
HCT: 45.2 % (ref 39.0–52.0)
Hemoglobin: 14.7 g/dL (ref 13.0–17.0)
MCH: 31.4 pg (ref 26.0–34.0)
MCHC: 32.5 g/dL (ref 30.0–36.0)
MCV: 96.6 fL (ref 80.0–100.0)
Platelets: 235 10*3/uL (ref 150–400)
RBC: 4.68 MIL/uL (ref 4.22–5.81)
RDW: 14.8 % (ref 11.5–15.5)
WBC: 10.5 10*3/uL (ref 4.0–10.5)
nRBC: 0 % (ref 0.0–0.2)

## 2020-10-16 LAB — BASIC METABOLIC PANEL
Anion gap: 7 (ref 5–15)
BUN: 13 mg/dL (ref 6–20)
CO2: 28 mmol/L (ref 22–32)
Calcium: 9.3 mg/dL (ref 8.9–10.3)
Chloride: 104 mmol/L (ref 98–111)
Creatinine, Ser: 1.14 mg/dL (ref 0.61–1.24)
GFR, Estimated: >60 mL/min (ref >60)
Glucose, Bld: 122 mg/dL — ABNORMAL HIGH (ref 70–99)
Potassium: 3.8 mmol/L (ref 3.5–5.1)
Sodium: 139 mmol/L (ref 135–145)

## 2020-10-16 LAB — TROPONIN I (HIGH SENSITIVITY)
Troponin I (High Sensitivity): 7 ng/L (ref <18)
Troponin I (High Sensitivity): 8 ng/L (ref <18)

## 2020-10-16 LAB — RESP PANEL BY RT-PCR (FLU A&B, COVID) ARPGX2
Influenza A by PCR: NEGATIVE
Influenza B by PCR: NEGATIVE
SARS Coronavirus 2 by RT PCR: NEGATIVE

## 2020-10-16 LAB — APTT: aPTT: 44 seconds — ABNORMAL HIGH (ref 24–36)

## 2020-10-16 LAB — MAGNESIUM: Magnesium: 2 mg/dL (ref 1.7–2.4)

## 2020-10-16 MED ORDER — LISINOPRIL 20 MG PO TABS
20.0000 mg | ORAL_TABLET | Freq: Every day | ORAL | Status: DC
  Administered 2020-10-17: 20 mg via ORAL
  Filled 2020-10-16: qty 1

## 2020-10-16 MED ORDER — ACETAMINOPHEN 325 MG PO TABS
650.0000 mg | ORAL_TABLET | Freq: Four times a day (QID) | ORAL | Status: DC | PRN
  Administered 2020-10-16 – 2020-10-17 (×2): 650 mg via ORAL
  Filled 2020-10-16 (×2): qty 2

## 2020-10-16 MED ORDER — HEPARIN (PORCINE) 25000 UT/250ML-% IV SOLN
1900.0000 [IU]/h | INTRAVENOUS | Status: DC
  Administered 2020-10-16: 1700 [IU]/h via INTRAVENOUS
  Filled 2020-10-16 (×2): qty 250

## 2020-10-16 MED ORDER — NITROGLYCERIN 0.4 MG SL SUBL
SUBLINGUAL_TABLET | SUBLINGUAL | Status: AC
  Filled 2020-10-16: qty 2

## 2020-10-16 MED ORDER — NITROGLYCERIN 0.4 MG SL SUBL
0.8000 mg | SUBLINGUAL_TABLET | Freq: Once | SUBLINGUAL | Status: AC
  Administered 2020-10-16: 0.8 mg via SUBLINGUAL

## 2020-10-16 MED ORDER — HALOPERIDOL 1 MG PO TABS
1.0000 mg | ORAL_TABLET | Freq: Every day | ORAL | Status: DC
  Administered 2020-10-16: 1 mg via ORAL
  Filled 2020-10-16: qty 1

## 2020-10-16 MED ORDER — SODIUM CHLORIDE 0.9% FLUSH
3.0000 mL | Freq: Two times a day (BID) | INTRAVENOUS | Status: DC
  Administered 2020-10-16 – 2020-10-17 (×2): 3 mL via INTRAVENOUS

## 2020-10-16 MED ORDER — POLYETHYLENE GLYCOL 3350 17 G PO PACK
17.0000 g | PACK | Freq: Every day | ORAL | Status: DC
  Administered 2020-10-17: 17 g via ORAL
  Filled 2020-10-16: qty 1

## 2020-10-16 MED ORDER — IOHEXOL 350 MG/ML SOLN
80.0000 mL | Freq: Once | INTRAVENOUS | Status: AC | PRN
  Administered 2020-10-16: 80 mL via INTRAVENOUS

## 2020-10-16 MED ORDER — LITHIUM CARBONATE 300 MG PO CAPS
600.0000 mg | ORAL_CAPSULE | Freq: Two times a day (BID) | ORAL | Status: DC
  Administered 2020-10-16 – 2020-10-17 (×2): 600 mg via ORAL
  Filled 2020-10-16 (×3): qty 2

## 2020-10-16 MED ORDER — HEPARIN BOLUS VIA INFUSION
4000.0000 [IU] | Freq: Once | INTRAVENOUS | Status: AC
  Administered 2020-10-16: 4000 [IU] via INTRAVENOUS
  Filled 2020-10-16: qty 4000

## 2020-10-16 MED ORDER — PRAVASTATIN SODIUM 10 MG PO TABS
20.0000 mg | ORAL_TABLET | Freq: Every day | ORAL | Status: DC
  Administered 2020-10-16: 20 mg via ORAL
  Filled 2020-10-16: qty 2

## 2020-10-16 MED ORDER — NICOTINE 7 MG/24HR TD PT24
7.0000 mg | MEDICATED_PATCH | Freq: Every day | TRANSDERMAL | Status: DC
  Administered 2020-10-17 (×2): 7 mg via TRANSDERMAL
  Filled 2020-10-16 (×2): qty 1

## 2020-10-16 MED ORDER — SODIUM CHLORIDE 0.9 % IV SOLN: 250.0000 mL | INTRAVENOUS | Status: DC | PRN

## 2020-10-16 MED ORDER — HYDROCHLOROTHIAZIDE 25 MG PO TABS
25.0000 mg | ORAL_TABLET | Freq: Every day | ORAL | Status: DC
  Administered 2020-10-17: 25 mg via ORAL
  Filled 2020-10-16: qty 1

## 2020-10-16 MED ORDER — ALBUTEROL SULFATE HFA 108 (90 BASE) MCG/ACT IN AERS
2.0000 | INHALATION_SPRAY | Freq: Four times a day (QID) | RESPIRATORY_TRACT | Status: DC | PRN
  Administered 2020-10-17: 2 via RESPIRATORY_TRACT
  Filled 2020-10-16: qty 6.7

## 2020-10-16 MED ORDER — SENNA 8.6 MG PO TABS: 1.0000 | ORAL_TABLET | Freq: Every day | ORAL | Status: DC | PRN

## 2020-10-16 MED ORDER — SODIUM CHLORIDE 0.9% FLUSH: 3.0000 mL | INTRAVENOUS | Status: DC | PRN

## 2020-10-16 MED ORDER — FLUTICASONE FUROATE-VILANTEROL 200-25 MCG/INH IN AEPB
1.0000 | INHALATION_SPRAY | Freq: Every day | RESPIRATORY_TRACT | Status: DC
  Administered 2020-10-17: 1 via RESPIRATORY_TRACT
  Filled 2020-10-16: qty 28

## 2020-10-16 NOTE — Telephone Encounter (Signed)
Patient is returning call.  °

## 2020-10-16 NOTE — Telephone Encounter (Signed)
Tried calling patient. No answer, no voice-mailbox is full can't leave a message.

## 2020-10-16 NOTE — Telephone Encounter (Signed)
Spoke to Over[place] just now and she let me know that there is a STAT result on the patients cardiac CT that he had done today.   I am routing to Dr. Servando Salina now as the result is available in Epic.

## 2020-10-16 NOTE — ED Provider Notes (Signed)
[facility] EMERGENCY DEPARTMENT Provider Note   CSN: [id number] Arrival date & time: 10/16/20  1325     History Chief Complaint  Patient presents with  . Shortness of Breath    Peter [name] is a 60 y.o. male.  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  Patient is referred here after having outpatient studies performed earlier today. He notes that he was recently diagnosed with DVT, left leg, has been taking his blood thinning medication regularly. However, with dyspnea, fatigue, he has been evaluated to his physician and stay had outpatient CT scan performed. He was referred here with concern for abnormal findings. Currently he has pain in his right lower chest, left leg.  No near syncope, no fever, no cough.  Dyspnea is worse with exertion, pain is not notably changed.     Past Medical History:  Diagnosis Date  . Anxiety   . Arthritis    lower back  . Bipolar disorder (HCC)   . Cervical radiculopathy 02/22/2012  . Closed fracture of one rib of right side 12/05/2019   Formatting of this note might be different from the original. Added automatically from request for surgery [id number]  . Depression   . Diaphragmatic hernia without obstruction and without gangrene 07/10/2019   Formatting of this note might be different from the original. Added automatically from request for surgery [id number]  . ED (erectile dysfunction)   . Essential (primary) hypertension   . Hyperlipidemia   . Lumbago with sciatica    Both sides  . Obesity due to excess calories   . Radiculopathy 12/20/2013  . Spinal stenosis of lumbar region 11/08/2018  . Tobacco use disorder 12/14/2019  . Wears dentures     Patient Active Problem List   Diagnosis Date Noted  . Wears dentures   . Obesity due to excess calories   . Lumbago with sciatica   . Essential (primary)  hypertension   . ED (erectile dysfunction)   . Depression   . Anxiety   . Bipolar disorder (HCC)   . Arthritis   . Hypertension   . Hyperlipidemia   . Tobacco use disorder 12/14/2019  . Closed fracture of one rib of right side 12/05/2019  . Diaphragmatic hernia without obstruction and without gangrene 07/10/2019  . Spinal stenosis of lumbar region 11/08/2018  . Radiculopathy 12/20/2013  . Cervical radiculopathy 02/22/2012    Past Surgical History:  Procedure Laterality Date  . ANTERIOR CERVICAL DECOMP/DISCECTOMY FUSION  03/01/2012   Procedure: ANTERIOR CERVICAL DECOMPRESSION/DISCECTOMY FUSION 2 LEVELS;  Surgeon: Venita Lick, MD;  Location: [facility] OR;  Service: Orthopedics;  Laterality: N/A;  ACDF C5-7  . ANTERIOR LAT LUMBAR FUSION Left 12/20/2013   Procedure: ANTERIOR LATERAL LUMBAR FUSION 1 LEVEL;  Surgeon: Emilee Hero, MD;  Location: [facility] OR;  Service: Orthopedics;  Laterality: Left;  Left lumbar 3-4 extreme left  lateral interbody fusion with instrumentation and allograft  . back fusion    . BACK SURGERY     x 2 - lower back  . JOINT REPLACEMENT     lt knee   acl  . LATERAL INTERBODY WITH INSTRUMENTATION L 3-4  12/20/2013  . WRIST SURGERY         Family History  Problem Relation Age of Onset  . Heart disease Mother   . Hypertension Mother   . Heart disease Father   . Hypertension  Father   . Diabetes Father     Social History   Tobacco Use  . Smoking status: Current Every Day Smoker    Packs/day: 1.00    Years: 30.00    Pack years: 30.00    Types: Cigarettes  . Smokeless tobacco: Never Used  . Tobacco comment: quit 07/2018 but sometimes smokes  Vaping Use  . Vaping Use: Never used  Substance Use Topics  . Alcohol use: Not Currently  . Drug use: No    Home Medications Prior to Admission medications   Medication Sig Start Date End Date Taking? Authorizing Provider  albuterol (PROVENTIL) (2.5 MG/3ML) 0.083% nebulizer solution Take 2.5 mg by nebulization  every 6 (six) hours as needed for wheezing or shortness of breath.    [provider]  albuterol (VENTOLIN HFA) 108 (90 Base) MCG/ACT inhaler Inhale 2 puffs into the lungs every 6 (six) hours as needed for wheezing or shortness of breath.    [provider]  apixaban (ELIQUIS) 5 MG TABS tablet Take 1 tablet by mouth 2 (two) times daily. 09/20/20 10/20/20  [provider]  budesonide-formoterol (SYMBICORT) 160-4.5 MCG/ACT inhaler Inhale 2 puffs into the lungs daily. 12/03/19   [provider]  Cholecalciferol 1.25 MG (50000 UT) capsule Take 1 capsule by mouth once a week.    [provider]  haloperidol (HALDOL) 2 MG/ML solution Take 1 mg by mouth at bedtime.    [provider]  lisinopril-hydrochlorothiazide (PRINZIDE,ZESTORETIC) 20-25 MG per tablet Take 1 tablet by mouth daily.    [provider]  LITHIUM CARBONATE PO Take 300 mg by mouth 2 (two) times daily.    [provider]  lovastatin (MEVACOR) 20 MG tablet Take 20 mg by mouth at bedtime.    [provider]  metoprolol tartrate (LOPRESSOR) 100 MG tablet Take 1 tablet (100 mg total) by mouth once for 1 dose. Take two hours prior to your cardiac CT 10/07/20 10/07/20  Tobb, Lavona MoundKardie, DO  sildenafil (VIAGRA) 100 MG tablet Take 100 mg by mouth daily as needed for erectile dysfunction.    [provider]    Allergies    Duloxetine, Duloxetine hcl, Gabapentin, and Lyrica [pregabalin]  Review of Systems   Review of Systems  Constitutional:       Per HPI, otherwise negative  HENT:       Per HPI, otherwise negative  Respiratory:       Per HPI, otherwise negative  Cardiovascular:       Per HPI, otherwise negative  Gastrointestinal: Negative for vomiting.  Endocrine:       Negative aside from HPI  Genitourinary:       Neg aside from HPI   Musculoskeletal:       Per HPI, otherwise negative  Skin: Negative.   Neurological: Positive for weakness. Negative for  syncope.    Physical Exam Updated Vital Signs BP 126/73 (BP Location: Left Arm)   Pulse (!) 54   Temp 97.8 F (36.6 C)   Resp 18   SpO2 100%   Physical Exam Vitals and nursing note reviewed.  Constitutional:      General: He is not in acute distress.    Appearance: He is well-developed.  HENT:     Head: Normocephalic and atraumatic.  Eyes:     Conjunctiva/sclera: Conjunctivae normal.  Cardiovascular:     Rate and Rhythm: Normal rate and regular rhythm.  Pulmonary:     Effort: Pulmonary effort is normal. No respiratory distress.  Breath sounds: No stridor. Decreased breath sounds present.  Abdominal:     General: There is no distension.  Musculoskeletal:     Left lower leg: Edema present.  Skin:    General: Skin is warm and dry.  Neurological:     Mental Status: He is alert and oriented to person, place, and time.     ED Results / Procedures / Treatments   Labs (all labs ordered are listed, but only abnormal results are displayed) Labs Reviewed  CBC  BASIC METABOLIC PANEL  TROPONIN I (HIGH SENSITIVITY)  TROPONIN I (HIGH SENSITIVITY)    EKG EKG Interpretation  Date/Time:  Thursday October 16 2020 13:38:02 EDT Ventricular Rate:  53 PR Interval:  206 QRS Duration: 80 QT Interval:  454 QTC Calculation: 426 R Axis:   -6 Text Interpretation: Sinus bradycardia Cannot rule out Anteroseptal infarct , age undetermined Abnormal ECG Confirmed by Gerhard Munch [phone]) on 10/16/2020 3:10:28 PM   Radiology DG Chest 2 View  Result Date: 10/16/2020 CLINICAL DATA:  Chest pain and shortness of breath. EXAM: CHEST - 2 VIEW COMPARISON:  12/13/2013 FINDINGS: Heart size normal. Aortic atherosclerotic calcifications. No pleural effusion or edema. No airspace consolidation or pneumothorax. Previous plate and screw fixation of the right ninth rib. IMPRESSION: 1. No acute cardiopulmonary abnormalities. 2.  Aortic Atherosclerosis (ICD10-I70.0). Electronically Signed   By: Signa Kell M.D.   On: 10/16/2020 14:48   CT CORONARY MORPH W/CTA COR W/SCORE W/CA W/CM &/OR WO/CM  Addendum Date: 10/16/2020   ADDENDUM REPORT: 10/16/2020 11:59 CLINICAL DATA:  This is a 60 year old male with chest pain. EXAM: Cardiac/Coronary CTA TECHNIQUE: The patient was scanned on a [employer]. FINDINGS: A 100 kV prospective scan was triggered in the descending thoracic aorta at 111 HU's. Axial non-contrast 3 mm slices were carried out through the heart. The data set was analyzed on a dedicated work station and scored using the Agatson method. Gantry rotation speed was 250 msecs and collimation was .6 mm. No beta blockade and 0.8 mg of sl NTG was given. The 3D data set was reconstructed in 5% intervals of the 67-82 % of the R-R cycle. Diastolic phases were analyzed on a dedicated work station using MPR, MIP and VRT modes. The patient received 80 cc of contrast. Noise artifact is moderate with mis-registration. Aorta:  Normal size.  No calcifications.  No dissection. Aortic Valve:  Trileaflet.  No calcifications. Coronary Arteries:  Normal coronary origin.  Right dominance. RCA is a large dominant artery that gives rise to PDA and PLA. There is minimal (<24%) calcification in the proximal RCA. The mid RCA with mis-alignment artifact can not exclude soft plaque. The distal RCA with minimal calcified plaque. Left main is a large artery that gives rise to LAD and LCX arteries. LAD is a large vessel that has no plaque. The proximal LAD with minimal calcified plaques. There is minimal calcified plaques in the mid LAD. The mid to distal LAD with mild myocardial bridging. The distal LAD with no plaques. LCX is a non-dominant artery that gives rise to one large OM1 branch. There is minimal calcified plaques in the proximal LCX. There mid and distal LCX with no plaques. Mid OM1 with mis-alignment artifact. Other findings: Normal pulmonary vein drainage into the left atrium. Normal left atrial appendage without  a thrombus. Normal size of the pulmonary artery. IMPRESSION: 1. Coronary calcium score of 143. This was 78 percentile for age and sex matched control. 2. Normal coronary  origin with right dominance. 3. Minimal coronary artery disease. CAD-RADS 1. Minimal non-obstructive CAD (0-24%). Consider non-atherosclerotic causes of chest pain. Consider preventive therapy and risk factor modification. Electronically Signed   By: Thomasene Ripple DO   On: 10/16/2020 11:59   Result Date: 10/16/2020 EXAM: OVER-READ INTERPRETATION  CT CHEST The following report is an over-read performed by radiologist Dr. Trudie Reed of [facility] on 10/16/2020. This over-read does not include interpretation of cardiac or coronary anatomy or pathology. The coronary calcium score/coronary CTA interpretation by the cardiologist is attached. COMPARISON:  None. FINDINGS: In the posterior basal segment of the right lower lobe (axial images 38-41 of series 12) there is a nonocclusive filling defect in a segmental sized pulmonary artery, indicative of pulmonary embolus. Within the visualized portions of the thorax there is no acute consolidative airspace disease, no pleural effusions, no pneumothorax and no lymphadenopathy. Postoperative changes of ORIF are noted in a lower right thoracic rib where there is widening of the interspace between the adjacent ribs, presumably related to remote trauma. Some ground-glass attenuation and architectural distortion is noted in the underlying lung, likely posttraumatic scarring. There is also some mild scarring in the inferior aspect of the right upper lobe and inferior segment of the lingula. 5 mm pulmonary nodule in the periphery of the right lower lobe (axial image 26 of series 13). No other larger more suspicious appearing pulmonary nodules or masses are noted in the visualized portions of the thorax. Visualized portions of the upper abdomen demonstrates severe diffuse low attenuation throughout the  visualized hepatic parenchyma, indicative of hepatic steatosis. There are no aggressive appearing lytic or blastic lesions noted in the visualized portions of the skeleton. IMPRESSION: 1. Study is positive for a nonocclusive segmental sized embolus to the right lower lobe. 2. 5 mm right lower lobe pulmonary nodule. This is nonspecific, but statistically likely benign. No follow-up needed if patient is low-risk. Non-contrast chest CT can be considered in 12 months if patient is high-risk. This recommendation follows the consensus statement: Guidelines for Management of Incidental Pulmonary Nodules Detected on CT Images: From the Fleischner Society 2017; Radiology 2017; 284:228-243. 3. Severe hepatic steatosis. 4. Posttraumatic and postoperative changes, as above. These results will be called to the ordering clinician or representative by the Radiologist Assistant, and communication documented in the PACS or [employer]. Electronically Signed: By: Trudie Reed M.D. On: 10/16/2020 08:17    Procedures Procedures   Medications Ordered in ED Medications - No data to display  ED Course  I have reviewed the triage vital signs and the nursing notes.  Pertinent labs & imaging results that were available during my care of the patient were reviewed by me and considered in my medical decision making (see chart for details).  Cardiac 60s sinus unremarkable Pulse ox 95% room air normal     I reviewed the patient's chart including CT angiography readings from today after I saw the patient.  I with concern for segmental pulmonary embolism, the context of a patient taking his anticoagulant for previously diagnosed DVT, there is suspicion for medication failure versus newly imaged PE that was present when the patient was initially diagnosed. However, given his symptoms, clear dyspnea with exertion, chest pain, patient will require initiation of heparin drip, admission for further monitoring,  management. MDM Rules/Calculators/A&P MDM Number of Diagnoses or Management Options Single subsegmental pulmonary embolism without acute cor pulmonale (HCC): new, needed workup   Amount and/or Complexity of Data Reviewed Clinical lab tests:  reviewed and ordered Tests in the radiology section of CPT: ordered and reviewed Tests in the medicine section of CPT: ordered and reviewed Decide to obtain previous medical records or to obtain history from someone other than the patient: yes Review and summarize past medical records: yes Independent visualization of images, tracings, or specimens: yes  Risk of Complications, Morbidity, and/or Mortality Presenting problems: high Diagnostic procedures: high Management options: high  Critical Care Total time providing critical care: 30-74 minutes (35)  Patient Progress Patient progress: stable   Final Clinical Impression(s) / ED Diagnoses Final diagnoses:  Single subsegmental pulmonary embolism without acute cor pulmonale (HCC)     Gerhard Munch, MD 10/16/20 1533

## 2020-10-16 NOTE — Telephone Encounter (Signed)
Attempted to call patient's mobile number 4 times and his home number 2 times. No answer at either number. Will attempt again.

## 2020-10-16 NOTE — ED Provider Notes (Addendum)
MSE was initiated and I personally evaluated the patient and placed orders (if any) at  1:40 PM on October 16, 2020.  The patient appears stable so that the remainder of the MSE may be completed by another provider.   Today's Vitals   10/16/20 1335 10/16/20 1336  BP: 126/73   Pulse: (!) 54   Resp: 18   Temp: 97.8 F (36.6 C)   SpO2: 100%   PainSc:  4       Anselm Pancoast, PA-C 10/16/20 1344    Anselm Pancoast, PA-C 10/16/20 1345    Gerhard Munch, MD 10/17/20 2344

## 2020-10-16 NOTE — Telephone Encounter (Signed)
I called the patient with his chest and coronary CT scan results which show evidence of posterior basal segment nonocclusive filling defect indicating pulmonary embolism.  The patient is currently taking Eliquis 5 mg twice daily.  He tells me that he is having shortness of breath and he is sitting up in a chair at home and he is unable to breathe.  He does not have pulse oximetry at home to assess his oxygen level.  I referred the patient to the emergency department for further assessment as he could be hypoxic due to his pulmonary embolism.  He plans to go to the nearest emergency department.  I also informed the patient that he has minimal coronary artery disease.

## 2020-10-16 NOTE — Telephone Encounter (Signed)
Dr. Servando Salina spoke with patient.

## 2020-10-16 NOTE — Research (Signed)
IDENTIFY Informed Consent   Subject Name: Peter [name]  Subject met inclusion and exclusion criteria.  The informed consent form, study requirements and expectations were reviewed with the subject and questions and concerns were addressed prior to the signing of the consent form.  The subject verbalized understanding of the trial requirements.  The subject agreed to participate in the IDENTIFY trial and signed the informed consent at [place] on 10/16/2020.  The informed consent was obtained prior to performance of any protocol-specific procedures for the subject.  A copy of the signed informed consent was given to the subject and a copy was placed in the subject's medical record.   [name] D

## 2020-10-16 NOTE — ED Notes (Signed)
Heparin pump and tubing changed out twice secondary to air in the line.  Heparin back did not have 250 when finally started due to having to change tubing and attempted to flush the line out twice.

## 2020-10-16 NOTE — H&P (Addendum)
Family Medicine Teaching [facility] Admission History and Physical Service Pager: [phone]  Patient name: Peter [name] Medical record number: [id number] Date of birth: [birth year] Age: 60 y.o. Gender: male  Primary Care Provider: Patient, No Pcp Per (Inactive) Consultants: None Code Status: FULL CODE Preferred Emergency Contact: Rossi Burdo (daughter) [phone]  Chief Complaint: Shortness of breath, fatigue  Assessment and Plan: Peter [name] is a 60 y.o. male presenting with SOB, fatigue found to have PE of posterior basal segment on outpatient coronary CTA. 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   Segmental PE of right lower lobe  recent history DVT Home meds include Eliquis 5 mg twice daily for recently diagnosed with DVT on March 12th. Was started on Eliquis at the time of DVT diagnosis. Visited his cardiologist on 3/29, who scheduled an outpatient coronary CTA for today (4/7) after he complained of some dyspnea on exertion.  Coronary CTA found mild coronary disease with higher than average calcium score; subsegmental PE of right lower lobe was found incidentally.  Cardiologist called patient back with results and instructed him to go to the nearest ED.  On admission, vital signs are stable, SPO2 95% on room air.  Respiratory panel negative for influenza A/B and Covid.  CBC and BMP unremarkable.  First troponin 7, awaiting second measurement.  EKG demonstrates sinus bradycardia at 53 bpm, narrow QRS with QTcB 426, no ST elevation present.  Chest x-ray negative for acute abnormalities. Patient without hypotension -Admit to progressive with Dr. McDiarmid attending -Heparin GTT -Continuous cardiac monitoring -Serial a.m. EKGs -Follow-up second troponin measurement -Follow-up magnesium add-on -Vitals per unit routine - ordered repeat  echocardiogram  -PT/OT eval and treat -A.m. CMP, CBC, BNP   Right lower lobe pulmonary nodule Found on coronary CTA today.  Size 5 mm, radiologist recommends noncontrast chest CT in 12 months as patient is high risk given extensive tobacco use.  Will recommend in discharge summary.  Severe hepatic steatosis Incidentally found on coronary CTA today.  Likely no action necessary, will have PCP follow-up outpatient. -CMP tomorrow morning to evaluate LFTs  Extensive tobacco use 45-pack-year history.  Patient reports wanting to quit, is currently trying patches, gums, candies.  Has cut down to 1/2 PPD.  -7 mg nicotine patch while inpatient  Asthma At home takes albuterol nebulizer or albuterol inhaler every 6 as needed along with Symbicort 2 puffs daily. -Continue Symbicort and albuterol inhaler as needed  Hypertension Home meds of lisinopril-hydrochlorothiazide 20-25 mg.  -Ordered as lisinopril 20 mg daily and hydrochlorothiazide 25 mg daily  Bipolar disorder Continuing home meds of Haldol 1 mg nightly, lithium carbonate 300 mg twice daily.  Erectile dysfunction Home med of Viagra 100 mg tablet daily as needed.  Of course holding while inpatient.   FEN/GI: Regular diet Prophylaxis: Heparin GGT  Disposition: Progressive  History of Present Illness:  Peter [name] is a 60 y.o. male presenting with SOB found to have segmental PE.   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 He reports that in preparation for an upcoming surgery, he had imaging that his cardiologist told him had a PE and to report to the ED. Patient reports this is his first PE. He denies family hx of blood clots. He has a brother that had two MIs.   Brief chronological chart  review (as most of his records are from the [facility] system and tedious to review):  07/26/2019 Surgical repair of diaphragmatic hernia and  intercostal hernia with Dr. Jacqulyn Bath, Cardiothoracic at [facility]. These hernias thought to be result of violent coughing related to pneumonia.   11/15/2019 Presented to ED for pain and swelling along thoracic post-op scar after twisting his chest while operating on a vehicle, was found to have closed fracture of right ninth rib.   12/13/2019 Open internal fixation of right 9th rib fracture with Dr. Jacqulyn Bath, Cardiothoracic at [facility]  10/11,16/21 ED visits for right arm/shoulder pain  07/06/20 Went to ED for dyspnea with exertion and fatigue x1 year, also bilateral foot swelling x1 month (left without being seen).   3/12 Seen in ED for left leg swelling and pain, found to have DVT. Not on Eliquis at that time. Started on Eliquis after this diagnosis.  09/23/20 Surg Onc telemed visit indicates that Mr. Cassedy has a recurrent right-sided chest wall/lung hernia, was planned for surgical repair on Mon 3/21. Rescheduled for 11/14/20 in light of DVT diagnosed 3/12.   10/04/20 Visited ED for SOB, but left before being seen.   3/29 Visited cardiology, c/o dyspnea on exertion. Cards obtained OP coronary CTA because of intermediate risk for CAD.   10/16/20 Coronary CTA performed, found minimal CAD and PE of posterior basal segment. Of note, patient currently on Eliquis 5 mg BID.    Review Of Systems: Per HPI with the following additions:   Review of Systems  Constitutional: Positive for fatigue. Negative for chills, diaphoresis and fever.  HENT: Negative for congestion and voice change.   Respiratory: Positive for cough, chest tightness and shortness of breath. Negative for choking.   Cardiovascular: Positive for leg swelling. Negative for chest pain.  Neurological: Negative for headaches.     Patient Active Problem List   Diagnosis Date Noted  . Pulmonary embolism (HCC) 10/16/2020  . Wears dentures   . Obesity due to excess calories   . Lumbago with sciatica   . Essential (primary) hypertension   . ED (erectile  dysfunction)   . Depression   . Anxiety   . Bipolar disorder (HCC)   . Arthritis   . Hypertension   . Hyperlipidemia   . Tobacco use disorder 12/14/2019  . Closed fracture of one rib of right side 12/05/2019  . Diaphragmatic hernia without obstruction and without gangrene 07/10/2019  . Spinal stenosis of lumbar region 11/08/2018  . Radiculopathy 12/20/2013  . Cervical radiculopathy 02/22/2012    Past Medical History: Past Medical History:  Diagnosis Date  . Anxiety   . Arthritis    lower back  . Bipolar disorder (HCC)   . Cervical radiculopathy 02/22/2012  . Closed fracture of one rib of right side 12/05/2019   Formatting of this note might be different from the original. Added automatically from request for surgery [id number]  . Depression   . Diaphragmatic hernia without obstruction and without gangrene 07/10/2019   Formatting of this note might be different from the original. Added automatically from request for surgery [id number]  . ED (erectile dysfunction)   . Essential (primary) hypertension   . Hyperlipidemia   . Lumbago with sciatica    Both sides  . Obesity due to excess calories   . Radiculopathy 12/20/2013  . Spinal stenosis of lumbar region 11/08/2018  . Tobacco use disorder 12/14/2019  . Wears dentures     Past Surgical History: Past Surgical History:  Procedure Laterality Date  .  ANTERIOR CERVICAL DECOMP/DISCECTOMY FUSION  03/01/2012   Procedure: ANTERIOR CERVICAL DECOMPRESSION/DISCECTOMY FUSION 2 LEVELS;  Surgeon: Venita Lick, MD;  Location: [facility] OR;  Service: Orthopedics;  Laterality: N/A;  ACDF C5-7  . ANTERIOR LAT LUMBAR FUSION Left 12/20/2013   Procedure: ANTERIOR LATERAL LUMBAR FUSION 1 LEVEL;  Surgeon: Emilee Hero, MD;  Location: [facility] OR;  Service: Orthopedics;  Laterality: Left;  Left lumbar 3-4 extreme left  lateral interbody fusion with instrumentation and allograft  . back fusion    . BACK SURGERY     x 2 - lower back  . JOINT REPLACEMENT     lt  knee   acl  . LATERAL INTERBODY WITH INSTRUMENTATION L 3-4  12/20/2013  . WRIST SURGERY      Social History: Social History   Tobacco Use  . Smoking status: Current Every Day Smoker    Packs/day: 1.00    Years: 45.00    Pack years: 45.00    Types: Cigarettes  . Smokeless tobacco: Never Used  . Tobacco comment: quit 07/2018 but sometimes smokes  Vaping Use  . Vaping Use: Never used  Substance Use Topics  . Alcohol use: Yes    Comment: rARE  . Drug use: No   Additional social history: None  Please also refer to relevant sections of EMR.  Family History: Family History  Problem Relation Age of Onset  . Heart disease Mother   . Hypertension Mother   . Heart disease Father   . Hypertension Father   . Diabetes Father    Allergies and Medications: Allergies  Allergen Reactions  . Duloxetine Anaphylaxis and Itching    Other reaction(s): Trouble Breathing  . Duloxetine Hcl Shortness Of Breath and Rash  . Gabapentin Shortness Of Breath and Rash    UNSPECIFIED REACTION BUT WILL BE CAUTIOUS DUE TO PREGABALIN > SHORTNESS OF BREATH, RASH  . Lyrica [Pregabalin] Shortness Of Breath and Rash   Current Facility-Administered Medications on File Prior to Encounter  Medication Dose Route Frequency Provider Last Rate Last Admin  . nitroGLYCERIN (NITROSTAT) 0.4 MG SL tablet            Current Outpatient Medications on File Prior to Encounter  Medication Sig Dispense Refill  . albuterol (PROVENTIL) (2.5 MG/3ML) 0.083% nebulizer solution Take 2.5 mg by nebulization every 6 (six) hours as needed for wheezing or shortness of breath.    Marland Kitchen albuterol (VENTOLIN HFA) 108 (90 Base) MCG/ACT inhaler Inhale 2 puffs into the lungs every 6 (six) hours as needed for wheezing or shortness of breath.    Marland Kitchen apixaban (ELIQUIS) 5 MG TABS tablet Take 1 tablet by mouth 2 (two) times daily.    . budesonide-formoterol (SYMBICORT) 160-4.5 MCG/ACT inhaler Inhale 2 puffs into the lungs daily.    . Cholecalciferol  1.25 MG (50000 UT) capsule Take 1 capsule by mouth once a week.    . haloperidol (HALDOL) 2 MG/ML solution Take 1 mg by mouth at bedtime.    Marland Kitchen lisinopril-hydrochlorothiazide (PRINZIDE,ZESTORETIC) 20-25 MG per tablet Take 1 tablet by mouth daily.    Marland Kitchen LITHIUM CARBONATE PO Take 300 mg by mouth 2 (two) times daily.    Marland Kitchen lovastatin (MEVACOR) 20 MG tablet Take 20 mg by mouth at bedtime.    . metoprolol tartrate (LOPRESSOR) 100 MG tablet Take 1 tablet (100 mg total) by mouth once for 1 dose. Take two hours prior to your cardiac CT 1 tablet 0  . sildenafil (VIAGRA) 100 MG tablet Take 100 mg by  mouth daily as needed for erectile dysfunction.      Objective: BP (!) 141/79   Pulse (!) 59   Temp 97.8 F (36.6 C)   Resp 19   Ht 5\' 10"  (1.778 m)   Wt 114.8 kg   SpO2 100%   BMI 36.31 kg/m   Exam: General: Awake, alert, oriented, no acute distress Eyes: EOM intact, PERRL Cardiovascular: Bradycardic with regular rhythm, no murmurs appreciated Respiratory: Mild wheezing in anterior superior fields Gastrointestinal: Bowel sounds present, obese abdomen, no tenderness MSK: Moving all extremities equally and spontaneously Neuro: Cranial nerves II through X grossly intact, no focal deficits Psych: Normal insight and judgment, excellent historian  Labs and Imaging: CBC BMET  Recent Labs  Lab 10/16/20 1342  WBC 10.5  HGB 14.7  HCT 45.2  PLT 235   Recent Labs  Lab 10/16/20 1342  NA 139  K 3.8  CL 104  CO2 28  BUN 13  CREATININE 1.14  GLUCOSE 122*  CALCIUM 9.3     EKG: sinus bradycardia at 53 bpm, narrow QRS with QTcB 426, no ST elevation present  Cardiac/Coronary CTA 10/16/2020 IMPRESSION: 1. Coronary calcium score of 143. This was 6180 percentile for age and sex matched control 2. Normal coronary origin with right dominance. 3. Minimal coronary artery disease. CAD-RADS 1. Minimal non-obstructive CAD (0-24%). Consider non-atherosclerotic causes of chest pain. Consider preventive  therapy and risk factor Modification. OVERREAD: 1. Study is positive for a nonocclusive segmental sized embolus to the right lower lobe. 2. 5 mm right lower lobe pulmonary nodule. This is nonspecific, but statistically likely benign. No follow-up needed if patient is low-risk. Non-contrast chest CT can be considered in 12 months if patient is high-risk. This recommendation follows the consensus statement: Guidelines for Management of Incidental Pulmonary Nodules Detected on CT Images: From the Fleischner Society 2017; Radiology 2017; 284:228-243. 3. Severe hepatic steatosis. 4. Posttraumatic and postoperative changes, as above.  CHEST - 2 VIEW 10/16/2020 COMPARISON:  12/13/2013 FINDINGS: Heart size normal. Aortic atherosclerotic calcifications. No pleural effusion or edema. No airspace consolidation or pneumothorax. Previous plate and screw fixation of the right ninth rib. IMPRESSION: 1. No acute cardiopulmonary abnormalities. 2.  Aortic Atherosclerosis (ICD10-I70.0).   [place] PhoLynn, Jadrien Narine, MD 10/16/2020, 5:53 PM PGY-1, [facility] FPTS Intern pager: [phone], text pages welcome   FPTS Upper-Level Resident Addendum   I have independently interviewed and examined the patient. I have discussed the above with the original author and agree with their documentation. My edits for correction/addition/clarification are included above. Please see any attending notes.   Ronnald RampMakiera Simmons-Robinson, MD PGY-2, [place] [facility] 10/16/2020 7:45 PM  FPTS Service pager: [phone] (text pages welcome through AMION)

## 2020-10-16 NOTE — Progress Notes (Signed)
ANTICOAGULATION CONSULT NOTE - Initial Consult  Pharmacy Consult for heparin dosing Indication: pulmonary embolus  Allergies  Allergen Reactions  . Duloxetine Anaphylaxis and Itching    Other reaction(s): Trouble Breathing  . Duloxetine Hcl Shortness Of Breath and Rash  . Gabapentin Shortness Of Breath and Rash    UNSPECIFIED REACTION BUT WILL BE CAUTIOUS DUE TO PREGABALIN > SHORTNESS OF BREATH, RASH  . Lyrica [Pregabalin] Shortness Of Breath and Rash    Patient Measurements: Height: 5\' 10"  (177.8 cm) Weight: 114.8 kg (253 lb 1.4 oz) IBW/kg (Calculated) : 73 Heparin Dosing Weight: 98.3 kg  Vital Signs: Temp: 97.8 F (36.6 C) (04/07 1335) BP: 126/73 (04/07 1335) Pulse Rate: 54 (04/07 1335)  Labs: Recent Labs    10/16/20 1342  HGB 14.7  HCT 45.2  PLT 235  CREATININE 1.14  TROPONINIHS 7    Estimated Creatinine Clearance: 90.7 mL/min (by C-G formula based on SCr of 1.14 mg/dL).   Medical History: Past Medical History:  Diagnosis Date  . Anxiety   . Arthritis    lower back  . Bipolar disorder (HCC)   . Cervical radiculopathy 02/22/2012  . Closed fracture of one rib of right side 12/05/2019   Formatting of this note might be different from the original. Added automatically from request for surgery 12/07/2019  . Depression   . Diaphragmatic hernia without obstruction and without gangrene 07/10/2019   Formatting of this note might be different from the original. Added automatically from request for surgery 07/12/2019  . ED (erectile dysfunction)   . Essential (primary) hypertension   . Hyperlipidemia   . Lumbago with sciatica    Both sides  . Obesity due to excess calories   . Radiculopathy 12/20/2013  . Spinal stenosis of lumbar region 11/08/2018  . Tobacco use disorder 12/14/2019  . Wears dentures     Assessment: 60 y.o. male admitted with SOB and found to have PE on imaging. Recently started on Eliquis for DVT treatment. Last dose 4/7 @ 11AM per patient. Initial CBC  wnl with Hgb 14.7, HCT 45.2, and Pltc 235. Pharmacy has been consulted to start IV heparin.   Goal of Therapy:  Heparin level 0.3-0.7 units/ml aPTT 66-102 seconds Monitor platelets by anticoagulation protocol: Yes   Plan:  Give 4000 units bolus x 1 Start heparin infusion at 1700 units/hr Check aPTT and anti-Xa level in 6 hours and daily until correlating Continue to monitor H&H and platelets  6/7, PharmD PGY1 Acute Care Pharmacy Resident 10/16/2020 3:44 PM  Please check [email or web address] for unit-specific pharmacy phone numbers.

## 2020-10-16 NOTE — Progress Notes (Signed)
Pt has arrived to 2 west 19. Alert and oriented x4, identified appropriately. VS stable, denied chest pain and SOB, no signs of distress.  Pt placed on progressive monitor and CCMD notified. Pt oriented to room and equipment, instructed on how to use call bell and call bell left within reach. Bed alarm in place. Will continue to monitor pt and treat per MD orders.

## 2020-10-16 NOTE — ED Notes (Signed)
Pt ambulated to restroom. 

## 2020-10-16 NOTE — Telephone Encounter (Signed)
Spoke with Lafonda Mosses with [facility]. Will send results to Dr. Servando Salina.

## 2020-10-16 NOTE — ED Triage Notes (Signed)
Pt with shob x 8-9 months, LLE swelling x 1 month secondary to DVT, still on Eliquis. Went to cardiologist who sent for cardiac and was called back with results to come to ED. Endorses chest pain and lack of energy.

## 2020-10-16 NOTE — Telephone Encounter (Signed)
[facility] called with a stat call report for this patient

## 2020-10-17 ENCOUNTER — Observation Stay (HOSPITAL_BASED_OUTPATIENT_CLINIC_OR_DEPARTMENT_OTHER): Payer: Medicare Other

## 2020-10-17 DIAGNOSIS — I2693 Single subsegmental pulmonary embolism without acute cor pulmonale: Secondary | ICD-10-CM | POA: Diagnosis not present

## 2020-10-17 DIAGNOSIS — I2602 Saddle embolus of pulmonary artery with acute cor pulmonale: Secondary | ICD-10-CM

## 2020-10-17 DIAGNOSIS — Z20822 Contact with and (suspected) exposure to covid-19: Secondary | ICD-10-CM | POA: Diagnosis not present

## 2020-10-17 DIAGNOSIS — R911 Solitary pulmonary nodule: Secondary | ICD-10-CM | POA: Diagnosis not present

## 2020-10-17 DIAGNOSIS — I82409 Acute embolism and thrombosis of unspecified deep veins of unspecified lower extremity: Secondary | ICD-10-CM | POA: Diagnosis not present

## 2020-10-17 DIAGNOSIS — K760 Fatty (change of) liver, not elsewhere classified: Secondary | ICD-10-CM

## 2020-10-17 LAB — COMPREHENSIVE METABOLIC PANEL
ALT: 22 U/L (ref 0–44)
AST: 19 U/L (ref 15–41)
Albumin: 3.5 g/dL (ref 3.5–5.0)
Alkaline Phosphatase: 57 U/L (ref 38–126)
Anion gap: 7 (ref 5–15)
BUN: 14 mg/dL (ref 6–20)
CO2: 26 mmol/L (ref 22–32)
Calcium: 8.9 mg/dL (ref 8.9–10.3)
Chloride: 105 mmol/L (ref 98–111)
Creatinine, Ser: 1.11 mg/dL (ref 0.61–1.24)
GFR, Estimated: >60 mL/min (ref >60)
Glucose, Bld: 110 mg/dL — ABNORMAL HIGH (ref 70–99)
Potassium: 3.8 mmol/L (ref 3.5–5.1)
Sodium: 138 mmol/L (ref 135–145)
Total Bilirubin: 0.3 mg/dL (ref 0.3–1.2)
Total Protein: 5.9 g/dL — ABNORMAL LOW (ref 6.5–8.1)

## 2020-10-17 LAB — CBC
HCT: 42.2 % (ref 39.0–52.0)
Hemoglobin: 14.1 g/dL (ref 13.0–17.0)
MCH: 31.8 pg (ref 26.0–34.0)
MCHC: 33.4 g/dL (ref 30.0–36.0)
MCV: 95.3 fL (ref 80.0–100.0)
Platelets: 238 10*3/uL (ref 150–400)
RBC: 4.43 MIL/uL (ref 4.22–5.81)
RDW: 14.6 % (ref 11.5–15.5)
WBC: 7.9 10*3/uL (ref 4.0–10.5)
nRBC: 0 % (ref 0.0–0.2)

## 2020-10-17 LAB — PROTIME-INR
INR: 1 (ref 0.8–1.2)
Prothrombin Time: 12.6 seconds (ref 11.4–15.2)

## 2020-10-17 LAB — HIV ANTIBODY (ROUTINE TESTING W REFLEX): HIV Screen 4th Generation wRfx: NONREACTIVE

## 2020-10-17 LAB — ECHOCARDIOGRAM COMPLETE
Area-P 1/2: 4.41 cm2
Height: 69 in
S' Lateral: 3.5 cm
Weight: 3961.23 oz

## 2020-10-17 LAB — HEPARIN LEVEL (UNFRACTIONATED): Heparin Unfractionated: 1.42 IU/mL — ABNORMAL HIGH (ref 0.30–0.70)

## 2020-10-17 LAB — MRSA PCR SCREENING: MRSA by PCR: NEGATIVE

## 2020-10-17 LAB — BRAIN NATRIURETIC PEPTIDE: B Natriuretic Peptide: 45 pg/mL (ref 0.0–100.0)

## 2020-10-17 MED ORDER — LITHIUM CARBONATE 300 MG PO CAPS: 900.0000 mg | ORAL_CAPSULE | Freq: Every day | ORAL | Status: DC

## 2020-10-17 MED ORDER — POLYETHYLENE GLYCOL 3350 17 G PO PACK: 17.0000 g | PACK | Freq: Every day | ORAL | 0 refills | Status: DC

## 2020-10-17 MED ORDER — LITHIUM CARBONATE 300 MG PO CAPS
300.0000 mg | ORAL_CAPSULE | Freq: Every day | ORAL | Status: DC
  Filled 2020-10-17: qty 1

## 2020-10-17 MED ORDER — ACETAMINOPHEN 325 MG PO TABS: 650.0000 mg | ORAL_TABLET | Freq: Four times a day (QID) | ORAL | Status: AC | PRN

## 2020-10-17 MED ORDER — HEPARIN BOLUS VIA INFUSION
2000.0000 [IU] | Freq: Once | INTRAVENOUS | Status: AC
  Administered 2020-10-17: 2000 [IU] via INTRAVENOUS
  Filled 2020-10-17: qty 2000

## 2020-10-17 MED ORDER — APIXABAN 5 MG PO TABS
5.0000 mg | ORAL_TABLET | Freq: Two times a day (BID) | ORAL | Status: DC
  Administered 2020-10-17: 5 mg via ORAL
  Filled 2020-10-17: qty 1

## 2020-10-17 NOTE — Discharge Instructions (Signed)
Dear Peter [name],  Thank you for letting us participate in your care. You were hospitalized for pulmonary embolism (blood clot in your lung). We do not believe this is a new clot that has formed since starting Eliquis. We believe based on your history and symptoms that this blood clot in your lungs was present at the time you were diagnosed with a DVT. Because of this, you are safe to stay on the same dose of Eliquis.   The scan your heart doctor did found severe fatty liver. Your liver function tests here in the hospital were normal. There is nothing to do to treat fatty liver. Although this condition can progress to cirrhosis. To improve your health and maintain your liver function, limit your alcohol consumption to no more than 2 drinks per day.   POST-HOSPITAL & CARE INSTRUCTIONS 1. Continue Eliquis 5 mg twice daily 2. Follow up with your primary care doctor and cardiologist 3. Go to your follow up appointments (listed below)   DOCTOR'S APPOINTMENT   Future Appointments  Date Time Provider Department Center  01/08/2021  8:00 AM Tobb, Lavona [place], DO [facility] None    Follow-up Information    Tobb, Kardie, DO. Schedule an appointment as soon as possible for a visit in 1 month(s).   Specialty: Cardiology Why: Make an appointment with your cardiologist to follow up as recommended.  Contact information: [address] [place] [address] [phone]        Woodroe Mode, MD. Schedule an appointment as soon as possible for a visit.   Specialty: Family Medicine Why: Follow up with your primary care doctor at [facility] within one week of discharge from the hospital.               Take care and be well!  Family Medicine Teaching Service Inpatient Team [place]  [facility]  [address] [place], [place] [address] [phone]     Pulmonary Embolism  A pulmonary embolism (PE) is a sudden blockage or decrease of blood flow in one or both  lungs that happens when a clot travels into the arteries of the lung (pulmonary arteries). Most blockages come from a blood clot that forms in the vein of a leg or arm (deep vein thrombosis, DVT) and travels to the lungs. A clot is blood that has thickened into a gel or solid. PE is a dangerous and life-threatening condition that needs to be treated right away. What are the causes? This condition is usually caused by a blood clot that forms in a vein and moves to the lungs. In rare cases, it may be caused by air, fat, part of a tumor, or other tissue that moves through the veins and into the lungs. What increases the risk? The following factors may make you more likely to develop this condition:  Experiencing a traumatic injury, such as breaking a hip or leg.  Having: ? A spinal cord injury. ? Major surgery, especially hip or knee replacement, or surgery on parts of the nervous system or on the abdomen. ? A stroke. ? A blood clotting disease. ? Long-term (chronic) lung or heart disease. ? Cancer, especially if you are being treated with chemotherapy. ? A central venous catheter.  Taking medicines that contain estrogen. These include birth control pills and hormone replacement therapy.  Being: ? Pregnant. ? In the period of time after your baby is delivered (postpartum). ? Older than age 39. ? Overweight. ?  A smoker, especially if you have other risks. ? Not very active (sedentary), not being able to move at all, or spending long periods sitting, such as travel over 6 hours. You are also at a greater risk if you have a leg in a cast or splint. What are the signs or symptoms? Symptoms of this condition usually start suddenly and include:  Shortness of breath during activity or at rest.  Coughing, coughing up blood, or coughing up bloody mucus.  Chest pain, back pain, or shoulder blade pain that gets worse with deep breaths.  Rapid or irregular heartbeat.  Feeling light-headed or  dizzy, or fainting.  Feeling anxious.  Pain and swelling in a leg. This is a symptom of DVT, which can lead to PE. How is this diagnosed? This condition may be diagnosed based on your medical history, a physical exam, and tests. Tests may include:  Blood tests.  An ECG (electrocardiogram) of the heart.  A CT pulmonary angiogram. This test checks blood flow in and around your lungs.  A ventilation-perfusion scan, also called a lung VQ scan. This test measures air flow and blood flow to the lungs.  An ultrasound to check for a DVT. How is this treated? Treatment for this condition depends on many factors, such as the cause of your PE, your risk for bleeding or developing more clots, and other medical conditions you may have. Treatment aims to stop blood clots from forming or growing larger. In some cases, treatment may be aimed at breaking apart or removing the blood clot. Treatment may include:  Medicines, such as: ? Blood thinning medicines, also called anticoagulants, to stop clots from forming and growing. ? Medicines that break apart clots (thrombolytics).  Procedures, such as: ? Using a flexible tube to remove a blood clot (embolectomy) or to deliver medicine to destroy it (catheter-directed thrombolysis). ? Surgery to remove the clot (surgical embolectomy). This is rare. You may need a combination of immediate, long-term, and extended treatments. Your treatment may continue for several months (maintenance therapy) or longer depending on your medical conditions. You and your health care provider will work together to choose the treatment program that is best for you. Follow these instructions at home: Medicines  Take over-the-counter and prescription medicines only as told by your health care provider.  If you are taking blood thinners: ? Talk with your health care provider before you take any medicines that contain aspirin or NSAIDs, such as ibuprofen. These medicines increase  your risk for dangerous bleeding. ? Take your medicine exactly as told, at the same time every day. ? Avoid activities that could cause injury or bruising, and follow instructions about how to prevent falls. ? Wear a medical alert bracelet or carry a card that lists what medicines you take.  Understand what foods and drugs interact with any medicines that you are taking. General instructions  Ask your health care provider when you may return to your normal activities. Avoid sitting or lying for a long time without moving.  Maintain a healthy weight. Ask your health care provider what weight is healthy for you.  Do not use any products that contain nicotine or tobacco, such as cigarettes, e-cigarettes, and chewing tobacco. If you need help quitting, ask your health care provider.  Talk with your health care provider about any travel plans. It is important to make sure that you are still able to take your medicine while traveling.  Keep all follow-up visits as told by your health  care provider. This is important. Where to find more information  American Lung Association: www.lung.org  Centers for Disease Control and Prevention: FootballExhibition.com.br Contact a health care provider if:  You missed a dose of your blood thinner medicine. Get help right away if you:  Have: ? New or increased pain, swelling, warmth, or redness in an arm or leg. ? Shortness of breath that gets worse during activity or at rest. ? A fever. ? Worsening chest pain. ? A rapid or irregular heartbeat. ? A severe headache. ? Vision changes. ? A serious fall or accident, or you hit your head. ? Stomach pain. ? Blood in your vomit, stool, or urine. ? A cut that will not stop bleeding.  Cough up blood.  Feel light-headed or dizzy, and that feeling does not go away.  Cannot move your arms or legs.  Are confused or have memory loss. These symptoms may represent a serious problem that is an emergency. Do not wait to  see if the symptoms will go away. Get medical help right away. Call your local emergency services (911 in the [place] Do not drive yourself to the hospital. Summary  A pulmonary embolism (PE) is a serious and potentially life-threatening condition, in which a blood clot from one part of the body (deep vein thrombosis, DVT) travels to the arteries of the lung, causing a sudden blockage or decrease of blood flow to the lungs. This may result in shortness of breath, chest pain, dizziness, and fainting.  Treatments for this condition usually include medicines to thin your blood (anticoagulants) or medicines to break apart blood clots (thrombolytics).  If you are given blood thinners, take your medicine exactly as told by your health care provider, at the same time every day. This is important.  Understand what foods and drugs interact with any medicines that you are taking.  If you have signs of PE or DVT, call your local emergency services (911 in the [place] This information is not intended to replace advice given to you by your health care provider. Make sure you discuss any questions you have with your health care provider. Document Revised: 05/11/2019 Document Reviewed: 05/11/2019 Elsevier Patient Education  2021 [employer].

## 2020-10-17 NOTE — Assessment & Plan Note (Signed)
5 mm RLL pulmonary nodule found incidentally on coronary CTA 10/16/2020. Currently 45 pack-year history at least. Recommend repeat non-contrast CT in future for monitoring.

## 2020-10-17 NOTE — Progress Notes (Shared)
Family Medicine Teaching Service Daily Progress Note Intern Pager: [phone]  Patient name: Peter [name] Medical record number: [id number] Date of birth: 03-31-1961 Age: 60 y.o. Gender: male  Primary Care Provider: Patient, No Pcp Per (Inactive) Consultants: Cardiology Code Status: Full  Pt Overview and Major Events to Date:  4/7 Admitted  Assessment and Plan:  Peter [name] is a 60 y.o. male presenting with SOB, fatigue found to have PE of posterior basal segment on outpatient coronary CTA. 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    Segmental PE of right lower lobe  recent history DVT Troponin 7, 8. BNP 45.0. BMP and CBC unremarkable aside from slightly increased glucose of 110.  Normal sinus rhythm, borderline bradycardia at 61 bpm, QTC be 444, no ST elevation.  Magnesium normal level. -Continue Heparin GTT -Continuous cardiac monitoring -Serial a.m. EKGs -Vitals per unit routine -Awaiting echocardiogram  -PT/OT eval and treat   Right lower lobe pulmonary nodule  Extensive tobacco use Found on coronary CTA on admission.  Size 5 mm, radiologist recommends noncontrast chest CT in 12 months as patient is high risk given extensive tobacco use. 45-pack-year history.  Patient reports wanting to quit, is currently trying patches, gums, candies.  Has cut down to 1/2 PPD.  -Consider pulmonary consult today -7 mg nicotine patch while inpatient   Severe hepatic steatosis Incidentally found on coronary CTA at admission.  AST, ALT, alk phos all normal.  Will mention on discharge summary for PCP follow-up.  No action at this time.    Asthma Continue home Symbicort and albuterol inhaler as needed   Hypertension Patient hypertensive overnight, systolics 628B-151V.  -Continue lisinopril 20 mg daily and hydrochlorothiazide 25 mg daily   Bipolar  disorder Continuing home meds of Haldol 1 mg nightly, lithium carbonate 300 mg twice daily.   FEN/GI: Heart healthy PPx: Heparin GGT   Status is: Observation  The patient remains OBS appropriate and will d/c before 2 midnights.  Dispo: The patient is from: Home              Anticipated d/c is to: Home              Patient currently is not medically stable to d/c.   Difficult to place patient No   Subjective:  ***  Objective: Temp:  [97.8 F (36.6 C)-98.6 F (37 C)] 98.5 F (36.9 C) (04/08 0411) Pulse Rate:  [49-62] 57 (04/08 0411) Resp:  [17-20] 18 (04/08 0411) BP: (106-170)/(63-94) 164/72 (04/08 0411) SpO2:  [97 %-100 %] 97 % (04/08 0411) Weight:  [112.3 kg-114.8 kg] 112.3 kg (04/07 1939) Physical Exam: General: *** Cardiovascular: *** Respiratory: *** Abdomen: *** Extremities: ***  Laboratory: Recent Labs  Lab 10/16/20 1342 10/17/20 0241  WBC 10.5 7.9  HGB 14.7 14.1  HCT 45.2 42.2  PLT 235 238   Recent Labs  Lab 10/16/20 1342 10/17/20 0241  NA 139 138  K 3.8 3.8  CL 104 105  CO2 28 26  BUN 13 14  CREATININE 1.14 1.11  CALCIUM 9.3 8.9  PROT  --  5.9*  BILITOT  --  0.3  ALKPHOS  --  57  ALT  --  22  AST  --  19  GLUCOSE 122* 110*    Imaging/Diagnostic Tests: None new.   Peter Essex, MD 10/17/2020, 7:16 AM PGY-1, [place] Intern pager: [phone], text  pages welcome

## 2020-10-17 NOTE — Progress Notes (Signed)
OT Cancellation Note  Patient Details Name: Peter [name] MRN: [id number] DOB: [birth year]   Cancelled Treatment:    Reason Eval/Treat Not Completed: OT screened, no needs identified, will sign off.    Eber Jones., OTR/L Acute Rehabilitation Services Pager [phone] Office [phone]   Jeani Hawking M 10/17/2020, 12:55 PM

## 2020-10-17 NOTE — Progress Notes (Signed)
2D echocardiogram completed.  10/17/2020 10:13 AM Eula Fried., MHA, RVT, RDCS, RDMS

## 2020-10-17 NOTE — Progress Notes (Signed)
ANTICOAGULATION CONSULT NOTE   Pharmacy Consult for Heparin  Indication: pulmonary embolus  Allergies  Allergen Reactions  . Duloxetine Anaphylaxis and Itching    Other reaction(s): Trouble Breathing  . Duloxetine Hcl Shortness Of Breath and Rash  . Gabapentin Shortness Of Breath and Rash    UNSPECIFIED REACTION BUT WILL BE CAUTIOUS DUE TO PREGABALIN > SHORTNESS OF BREATH, RASH  . Lyrica [Pregabalin] Shortness Of Breath and Rash    Patient Measurements: Height: 5\' 9"  (175.3 cm) Weight: 112.3 kg (247 lb 9.2 oz) IBW/kg (Calculated) : 70.7 Heparin Dosing Weight: 98.3 kg  Vital Signs: Temp: 98.6 F (37 C) (04/08 0022) Temp Source: Oral (04/08 0022) BP: 126/63 (04/08 0022) Pulse Rate: 60 (04/08 0022)  Labs: Recent Labs    10/16/20 1342 10/16/20 1619 10/16/20 2247  HGB 14.7  --   --   HCT 45.2  --   --   PLT 235  --   --   APTT  --   --  44*  HEPARINUNFRC  --   --  1.42*  CREATININE 1.14  --   --   TROPONINIHS 7 8  --     Estimated Creatinine Clearance: 88.3 mL/min (by C-G formula based on SCr of 1.14 mg/dL).   Medical History: Past Medical History:  Diagnosis Date  . Anxiety   . Arthritis    lower back  . Bipolar disorder (HCC)   . Cervical radiculopathy 02/22/2012  . Closed fracture of one rib of right side 12/05/2019   Formatting of this note might be different from the original. Added automatically from request for surgery 12/07/2019  . Depression   . Diaphragmatic hernia without obstruction and without gangrene 07/10/2019   Formatting of this note might be different from the original. Added automatically from request for surgery 07/12/2019  . ED (erectile dysfunction)   . Essential (primary) hypertension   . Hyperlipidemia   . Lumbago with sciatica    Both sides  . Obesity due to excess calories   . Radiculopathy 12/20/2013  . Spinal stenosis of lumbar region 11/08/2018  . Tobacco use disorder 12/14/2019  . Wears dentures     Assessment: 60 y.o. male  admitted with SOB and found to have PE on imaging. Recently started on Eliquis for DVT treatment. Last dose 4/7 @ 11AM per patient. Initial CBC wnl with Hgb 14.7, HCT 45.2, and Pltc 235. Pharmacy has been consulted to start IV heparin.   4/8 AM update:  Initial aPTT is low Heparin level elevated as expected due to recent Eliquis  No issues per RN  Goal of Therapy:  Heparin level 0.3-0.7 units/ml aPTT 66-102 seconds Monitor platelets by anticoagulation protocol: Yes   Plan:  Heparin 2000 units re-[employer] heparin to 1900 units/hr Re-check heparin level and aPTT in 8 hours  6/8, PharmD, BCPS Clinical Pharmacist Phone: [phone]

## 2020-10-17 NOTE — Evaluation (Signed)
Physical Therapy Evaluation Patient Details Name: Peter [name] MRN: [id number] DOB: 30-May-1961 Today's Date: 10/17/2020   History of Present Illness  60 y.o. male presents to [facility] ED after referral from cardiologist on 4/7/202259 y.o. male presents to [facility] ED on 10/16/2020 with reports of SOB, found to have PE. PMH includes asthma, HTN, bipolar disorder, diaphragmatic and intercostal hernia s/p surgical repair (07/26/19), right 9th rib fx s/p surgical fixation (12/13/19).  Clinical Impression  Pt presents to PT with mild deficits in activity tolerance but is otherwise able to perform all mobility independently at this time. Pt reports increased work of breathing when ambulating, but recovers quickly once resting and is able to ambulate household and limited community distances. PT encourages the patient to ambulate at least 3 times a day out of the room to continue to improve upon activity tolerance. Pt has no further acute PT needs. Acute PT signing off.    Follow Up Recommendations No PT follow up    Equipment Recommendations  None recommended by PT    Recommendations for Other Services       Precautions / Restrictions Precautions Precautions: Other (comment) Precaution Comments: monitor SpO2 and HR Restrictions Weight Bearing Restrictions: No      Mobility  Bed Mobility Overal bed mobility: Independent                  Transfers Overall transfer level: Independent                  Ambulation/Gait Ambulation/Gait assistance: Independent Gait Distance (Feet): 300 Feet Assistive device: None Gait Pattern/deviations: WFL(Within Functional Limits) Gait velocity: functional Gait velocity interpretation: 1.31 - 2.62 ft/sec, indicative of limited community ambulator General Gait Details: pt with steady step through gait, no observed balance deviations  Stairs            Wheelchair Mobility    Modified Rankin (Stroke Patients Only)       Balance Overall  balance assessment: Independent                                           Pertinent Vitals/Pain Pain Assessment: Faces Faces Pain Scale: Hurts little more Pain Location: low back Pain Descriptors / Indicators: Aching Pain Intervention(s): Monitored during session    Home Living Family/patient expects to be discharged to:: Private residence Living Arrangements: Spouse/significant other Available Help at Discharge: Family;Available 24 hours/day Type of Home: House Home Access: Stairs to enter Entrance Stairs-Rails: Right;Left;Can reach both Entrance Stairs-Number of Steps: 3 Home Layout: One level Home Equipment: None      Prior Function Level of Independence: Independent         Comments: on disability     Hand Dominance        Extremity/Trunk Assessment   Upper Extremity Assessment Upper Extremity Assessment: Overall WFL for tasks assessed    Lower Extremity Assessment Lower Extremity Assessment: Overall WFL for tasks assessed    Cervical / Trunk Assessment Cervical / Trunk Assessment: Normal  Communication   Communication: No difficulties  Cognition Arousal/Alertness: Awake/alert Behavior During Therapy: WFL for tasks assessed/performed Overall Cognitive Status: Within Functional Limits for tasks assessed                                        General  Comments General comments (skin integrity, edema, etc.): VSS on RA, HR peak in mid 90s, SpO2 from 95-97%. Pt reports increased work of breathing but recovers quickly    Exercises     Assessment/Plan    PT Assessment Patent does not need any further PT services  PT Problem List         PT Treatment Interventions      PT Goals (Current goals can be found in the Care Plan section)       Frequency     Barriers to discharge        Co-evaluation               AM-PAC PT "6 Clicks" Mobility  Outcome Measure Help needed turning from your back to your side  while in a flat bed without using bedrails?: None Help needed moving from lying on your back to sitting on the side of a flat bed without using bedrails?: None Help needed moving to and from a bed to a chair (including a wheelchair)?: None Help needed standing up from a chair using your arms (e.g., wheelchair or bedside chair)?: None Help needed to walk in hospital room?: None Help needed climbing 3-5 steps with a railing? : None 6 Click Score: 24    End of Session   Activity Tolerance: Patient tolerated treatment well Patient left: in bed;with call bell/phone within reach Nurse Communication: Mobility status      Time: 8527-7824 PT Time Calculation (min) (ACUTE ONLY): 13 min   Charges:   PT Evaluation $PT Eval Low Complexity: 1 Low          Arlyss Gandy, PT, DPT Acute Rehabilitation Pager: [phone]   Arlyss Gandy 10/17/2020, 10:30 AM

## 2020-10-17 NOTE — Hospital Course (Addendum)
Peter [name] is a 60 y.o. male presenting with SOB, fatigue found to have PE of posterior basal segment on outpatient coronary CTA. 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    Segmental PE of right lower lobe  recent history DVT Visited his cardiologist on 3/29, who scheduled an outpatient coronary CTA for today (4/7) after he complained of some dyspnea on exertion.  Coronary CTA found mild coronary disease with higher than average calcium score; subsegmental PE of right lower lobe was found incidentally.  Cardiologist called patient back with results and instructed him to go to the nearest ED.  On admission, vital signs are stable, SPO2 95% on room air.  Respiratory panel negative for influenza A/B and Covid.  CBC and BMP unremarkable.  Troponins negative, 7 and 8. EKG showed sinus bradycardia at 53 bpm, narrow QRS with QTcB 426, without ST elevation. Chest x-ray negative for acute abnormalities. Patient normotensive with normal pulse (borderline bradycardic). CBC and CMP unremarkable aside from elevated glucose of 122, 110. BNP 45.0. Lab work indicates compliance with Eliquis. Patient started on heparin drip while overnight. Given that patient has been experiencing worsening leg swelling and dyspnea for months as evidenced by chart review of [facility] records, along with being compliant on Eliquis for a couple weeks, we believe that this PE was present at the time of DVT diagnosis on 3/12. We do not believe this was a failure of anticoagulation therapy. Patient was discharged on Eliquis 5 mg BID and instructed to have close follow up with PCP and cardiologist.   Incidental Findings on Imaging: Right lower lobe pulmonary nodule, 5 mm in size Recommend repeat imaging in 6 months to monitor for progression given extensive smoking history (45 pack-years)  Severe Hepatic  Steatosis LFTs within normal limits. No evidence of hepatic dysfunction. Recommend follow up of LFTs as clinically indicated.   Other Chronic Conditions Stable During Admission: Extensive tobacco use Asthma Hypertension Bipolar disorder Erectile dysfunction

## 2020-10-18 NOTE — Discharge Summary (Signed)
Family Medicine Teaching [facility] Discharge Summary  Patient name: Peter [name] Medical record number: [id number] Date of birth: [birth year] Age: 60 y.o. Gender: male Date of Admission: 10/16/2020  Date of Discharge: 10/17/2020 Admitting Physician: Fayette Pho, MD  Primary Care Provider: Patient, No Pcp Per (Inactive) Consultants: Cardiology  Indication for Hospitalization: Dyspnea, PE of RLL  Discharge Diagnoses/Problem List:  Segmental PE of right lower lobe Recent DVT Right LL pulmonary nodule Extensive tobacco use Severe hepatic steatosis Asthma Hypertension Bipolar disorder  Disposition: Home  Discharge Condition: Stable  Discharge Exam:  Temp:  [97.8 F (36.6 C)-98.6 F (37 C)] 98.5 F (36.9 C) (04/08 0411) Pulse Rate:  [49-62] 57 (04/08 0411) Resp:  [17-20] 18 (04/08 0411) BP: (106-170)/(63-94) 164/72 (04/08 0411) SpO2:  [97 %-100 %] 97 % (04/08 0411) Weight:  [112.3 kg-114.8 kg] 112.3 kg (04/07 1939) Physical Exam General: awake, alert, oriented, sitting up in chair Respiratory: unlabored respirations, no increased work of breathing, no respiratory distress Extremities: moving all spontaneously, no edema Neuro: cranial nerves II-X grossly intact Psych: appears to have normal insight and judgement  Brief Hospital Course:  Peter [name] is a 60 y.o. male presenting with SOB, fatigue found to have PE of posterior basal segment on outpatient coronary CTA. 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    Segmental PE of right lower lobe  recent history DVT Visited his cardiologist on 3/29, who scheduled an outpatient coronary CTA for today (4/7) after he complained of some dyspnea on exertion.  Coronary CTA found mild coronary disease with higher than average calcium score; subsegmental PE of right lower lobe was found incidentally.   Cardiologist called patient back with results and instructed him to go to the nearest ED.  On admission, vital signs are stable, SPO2 95% on room air.  Respiratory panel negative for influenza A/B and Covid.  CBC and BMP unremarkable.  Troponins negative, 7 and 8. EKG showed sinus bradycardia at 53 bpm, narrow QRS with QTcB 426, without ST elevation. Chest x-ray negative for acute abnormalities. Patient normotensive with normal pulse (borderline bradycardic). CBC and CMP unremarkable aside from elevated glucose of 122, 110. BNP 45.0. Lab work indicates compliance with Eliquis. Patient started on heparin drip while overnight. Given that patient has been experiencing worsening leg swelling and dyspnea for months as evidenced by chart review of [facility] records, along with being compliant on Eliquis for a couple weeks, we believe that this PE was present at the time of DVT diagnosis on 3/12. We do not believe this was a failure of anticoagulation therapy. Patient was discharged on Eliquis 5 mg BID and instructed to have close follow up with PCP and cardiologist.   Incidental Findings on Imaging: Right lower lobe pulmonary nodule, 5 mm in size Recommend repeat imaging in 6 months to monitor for progression given extensive smoking history (45 pack-years)  Severe Hepatic Steatosis LFTs within normal limits. No evidence of hepatic dysfunction. Recommend follow up of LFTs as clinically indicated.   Other Chronic Conditions Stable During Admission: Extensive tobacco use Asthma Hypertension Bipolar disorder Erectile dysfunction   Issues for Follow Up:  1. Continued on Eliquis 5mg  BID as we believe based on history and clinical presentation that this is not a new PE while on Eliquis but rather a PE that likely occurred at the time of DVT.  2. Incidental RLL pulm nodule: Recommend for CT  chest around October for pulmonary nodule noted in RLL.  3. Incidental severe hepatic steatosis: No evidence of hepatic  dysfunction during admission. Recommend follow up of LFTs as clinically indicated. 4. Smoking cessation: Patient currently smoking 1/2 PPD, trying to cut down. History of 45 pack-years.   Significant Procedures: None  Significant Labs and Imaging:  Recent Labs  Lab 10/16/20 1342 10/17/20 0241  WBC 10.5 7.9  HGB 14.7 14.1  HCT 45.2 42.2  PLT 235 238   Recent Labs  Lab 10/16/20 1342 10/16/20 2247 10/17/20 0241  NA 139  --  138  K 3.8  --  3.8  CL 104  --  105  CO2 28  --  26  GLUCOSE 122*  --  110*  BUN 13  --  14  CREATININE 1.14  --  1.11  CALCIUM 9.3  --  8.9  MG  --  2.0  --   ALKPHOS  --   --  57  AST  --   --  19  ALT  --   --  22  ALBUMIN  --   --  3.5    Cardiac/Coronary CTA 10/16/2020 TECHNIQUE: The patient was scanned on a [employer] scanner. FINDINGS: A 100 kV prospective scan was triggered in the descending thoracic aorta at 111 HU's. Axial non-contrast 3 mm slices were carried out through the heart. The data set was analyzed on a dedicated work station and scored using the Agatson method. Gantry rotation speed was 250 msecs and collimation was .6 mm. No beta blockade and 0.8 mg of sl NTG was given. The 3D data set was reconstructed in 5% intervals of the 67-82 % of the R-R cycle. Diastolic phases were analyzed on a dedicated work station using MPR, MIP and VRT modes. The patient received 80 cc of contrast.  Noise artifact is moderate with mis-registration.  Aorta:  Normal size.  No calcifications.  No dissection.  Aortic Valve:  Trileaflet.  No calcifications.  Coronary Arteries:  Normal coronary origin.  Right dominance.  RCA is a large dominant artery that gives rise to PDA and PLA. There is minimal (<24%) calcification in the proximal RCA. The mid RCA with mis-alignment artifact can not exclude soft plaque. The distal RCA with minimal calcified plaque.  Left main is a large artery that gives rise to LAD and LCX arteries.  LAD is a  large vessel that has no plaque. The proximal LAD with minimal calcified plaques. There is minimal calcified plaques in the mid LAD. The mid to distal LAD with mild myocardial bridging. The distal LAD with no plaques.  LCX is a non-dominant artery that gives rise to one large OM1 branch. There is minimal calcified plaques in the proximal LCX. There mid and distal LCX with no plaques. Mid OM1 with mis-alignment artifact.  Other findings:  Normal pulmonary vein drainage into the left atrium.  Normal left atrial appendage without a thrombus.  Normal size of the pulmonary artery.  IMPRESSION: 1. Coronary calcium score of 143. This was 280 percentile for age and sex matched control.  2. Normal coronary origin with right dominance.  3. Minimal coronary artery disease. CAD-RADS 1. Minimal non-obstructive CAD (0-24%). Consider non-atherosclerotic causes of chest pain. Consider preventive therapy and risk factor Modification.  OVER-READ INTERPRETATION  CT CHEST  The following report is an over-read performed by radiologist Dr. Trudie Reedaniel Entrikin of [facility] on 10/16/2020. This over-read does not include interpretation of cardiac or coronary anatomy or  pathology. The coronary calcium score/coronary CTA interpretation by the cardiologist is attached.  COMPARISON:  None.  FINDINGS: In the posterior basal segment of the right lower lobe (axial images 38-41 of series 12) there is a nonocclusive filling defect in a segmental sized pulmonary artery, indicative of pulmonary embolus. Within the visualized portions of the thorax there is no acute consolidative airspace disease, no pleural effusions, no pneumothorax and no lymphadenopathy. Postoperative changes of ORIF are noted in a lower right thoracic rib where there is widening of the interspace between the adjacent ribs, presumably related to remote trauma. Some ground-glass attenuation and  architectural distortion is noted in the underlying lung, likely posttraumatic scarring. There is also some mild scarring in the inferior aspect of the right upper lobe and inferior segment of the lingula. 5 mm pulmonary nodule in the periphery of the right lower lobe (axial image 26 of series 13). No other larger more suspicious appearing pulmonary nodules or masses are noted in the visualized portions of the thorax. Visualized portions of the upper abdomen demonstrates severe diffuse low attenuation throughout the visualized hepatic parenchyma, indicative of hepatic steatosis. There are no aggressive appearing lytic or blastic lesions noted in the visualized portions of the skeleton.  IMPRESSION: 1. Study is positive for a nonocclusive segmental sized embolus to the right lower lobe. 2. 5 mm right lower lobe pulmonary nodule. This is nonspecific, but statistically likely benign. No follow-up needed if patient is low-risk. Non-contrast chest CT can be considered in 12 months if patient is high-risk. This recommendation follows the consensus statement: Guidelines for Management of Incidental Pulmonary Nodules Detected on CT Images: From the Fleischner Society 2017; Radiology 2017; 284:228-243. 3. Severe hepatic steatosis. 4. Posttraumatic and postoperative changes, as above.  These results will be called to the ordering clinician or representative by the Radiologist Assistant, and communication documented in the PACS or [employer].   Results/Tests Pending at Time of Discharge: None  Discharge Medications:  Allergies as of 10/17/2020      Reactions   Duloxetine Anaphylaxis, Itching   Other reaction(s): Trouble Breathing   Gabapentin Shortness Of Breath, Rash   UNSPECIFIED REACTION BUT WILL BE CAUTIOUS DUE TO PREGABALIN > SHORTNESS OF BREATH, RASH   Lyrica [pregabalin] Shortness Of Breath, Rash      Medication List    TAKE these medications   acetaminophen 325 MG  tablet Commonly known as: TYLENOL Take 2 tablets (650 mg total) by mouth every 6 (six) hours as needed for mild pain or moderate pain.   albuterol 108 (90 Base) MCG/ACT inhaler Commonly known as: VENTOLIN HFA Inhale 2 puffs into the lungs every 6 (six) hours as needed for wheezing or shortness of breath.   albuterol (2.5 MG/3ML) 0.083% nebulizer solution Commonly known as: PROVENTIL Take 2.5 mg by nebulization every 6 (six) hours as needed for wheezing or shortness of breath.   apixaban 5 MG Tabs tablet Commonly known as: ELIQUIS Take 1 tablet by mouth 2 (two) times daily.   budesonide-formoterol 160-4.5 MCG/ACT inhaler Commonly known as: SYMBICORT Inhale 2 puffs into the lungs daily.   Cholecalciferol 1.25 MG (50000 UT) capsule Take 50,000 Units by mouth once a week.   Goodys Extra Strength [id number] MG Pack Generic drug: Aspirin-Acetaminophen-Caffeine Take 2 Packages by mouth 3 (three) times daily as needed (back pain).   haloperidol 2 MG/ML solution Commonly known as: HALDOL Take 1 mg by mouth at bedtime.   lisinopril-hydrochlorothiazide 20-25 MG tablet Commonly known as: ZESTORETIC Take 1 tablet  by mouth daily.   LITHIUM CARBONATE PO Take 900 mg by mouth at bedtime.   lovastatin 20 MG tablet Commonly known as: MEVACOR Take 20 mg by mouth at bedtime.   metoprolol tartrate 100 MG tablet Commonly known as: LOPRESSOR Take 1 tablet (100 mg total) by mouth once for 1 dose. Take two hours prior to your cardiac CT   polyethylene glycol 17 g packet Commonly known as: MIRALAX / GLYCOLAX Take 17 g by mouth daily.   sildenafil 100 MG tablet Commonly known as: VIAGRA Take 100 mg by mouth daily as needed for erectile dysfunction.   Trelegy Ellipta 100-62.5-25 MCG/INH Aepb Generic drug: Fluticasone-Umeclidin-Vilant Inhale 1 puff into the lungs daily.       Discharge Instructions: Please refer to Patient Instructions section of EMR for full details.  Patient was  counseled important signs and symptoms that should prompt return to medical care, changes in medications, dietary instructions, activity restrictions, and follow up appointments.   Follow-Up Appointments:  Follow-up Information    Tobb, Kardie, DO. Schedule an appointment as soon as possible for a visit in 1 month(s).   Specialty: Cardiology Why: Make an appointment with your cardiologist to follow up as recommended.  Contact information: [address] [place] [address] [phone]        Woodroe Mode, MD. Schedule an appointment as soon as possible for a visit.   Specialty: Family Medicine Why: Follow up with your primary care doctor at [facility] within one week of discharge from the hospital.               Fayette Pho, MD 10/18/2020, 5:49 PM PGY-1, [facility]

## 2020-11-18 DIAGNOSIS — J984 Other disorders of lung: Secondary | ICD-10-CM | POA: Insufficient documentation

## 2021-01-06 DIAGNOSIS — I8290 Acute embolism and thrombosis of unspecified vein: Secondary | ICD-10-CM | POA: Insufficient documentation

## 2021-01-08 ENCOUNTER — Ambulatory Visit: Payer: Medicare Other | Admitting: Cardiology

## 2021-02-12 ENCOUNTER — Telehealth: Payer: Self-pay

## 2021-02-12 DIAGNOSIS — Z006 Encounter for examination for normal comparison and control in clinical research program: Secondary | ICD-10-CM

## 2021-02-12 NOTE — Telephone Encounter (Signed)
Called patient for 90 day Identify phone call pt stated he is not having anymore cardiac symptoms but did follow up with PCP, I reminded the patient that we would be calling him back around April for a year follow up phone call.  

## 2021-05-26 ENCOUNTER — Other Ambulatory Visit: Payer: Self-pay | Admitting: Neurosurgery

## 2021-05-26 DIAGNOSIS — M48061 Spinal stenosis, lumbar region without neurogenic claudication: Secondary | ICD-10-CM

## 2021-05-30 ENCOUNTER — Ambulatory Visit
Admission: RE | Admit: 2021-05-30 | Discharge: 2021-05-30 | Disposition: A | Discharge status: Home / Self Care | Payer: Medicare Other | Source: Ambulatory Visit | Attending: Neurosurgery | Admitting: Neurosurgery

## 2021-05-30 ENCOUNTER — Other Ambulatory Visit: Payer: Self-pay

## 2021-05-30 DIAGNOSIS — M48061 Spinal stenosis, lumbar region without neurogenic claudication: Secondary | ICD-10-CM

## 2021-06-12 ENCOUNTER — Telehealth: Payer: Self-pay

## 2021-06-12 NOTE — Telephone Encounter (Signed)
   [facility] Pre-operative Risk Assessment    Patient Name: Peter [name]  DOB: 07/13/1960 MRN: [id number]    Request for surgical clearance:  What type of surgery is being performed? L1-2 LUMBAR FUSION  When is this surgery scheduled? TBD  What type of clearance is required (medical clearance vs. Pharmacy clearance to hold med vs. Both)? BOTH  Are there any medications that need to be held prior to surgery and how long? [place] name and name of physician performing surgery? [facility] ATTN:VANESSA  What is the office phone number? [phone]   7.   What is the office fax number? [phone]  8.   Anesthesia type (None, local, MAC, general) ? GENERAL

## 2021-06-15 NOTE — Telephone Encounter (Signed)
Will you address the eliquis for DVT/PE or his PCP?  Thanks.

## 2021-06-16 NOTE — Telephone Encounter (Signed)
   Name: Peter [name]  DOB: Jun 17, 1961  MRN: [id number]   Primary Cardiologist: Thomasene Ripple, DO  Chart reviewed as part of pre-operative protocol coverage. Patient was contacted 06/16/2021 in reference to pre-operative risk assessment for pending surgery as outlined below.  Peter [name] was last seen on 10/07/20 by Dr. Servando Salina.  Since that day, Peter [name] has done well. He has nonobstructive CAD by CCTA and reassuring echocardiogram. He can complete more than 4.0 METS without angina. He is primarily limited by his back pain.   Will defer to PCP for eliquis hold for PE/DVT.   Therefore, based on ACC/AHA guidelines, the patient would be at acceptable risk for the planned procedure without further cardiovascular testing.   The patient was advised that if he develops new symptoms prior to surgery to contact our office to arrange for a follow-up visit, and he verbalized understanding.  I will route this recommendation to the requesting party via Epic fax function and remove from pre-op pool. Please call with questions.  Marcelino Duster, PA 06/16/2021, 2:37 PM

## 2021-06-16 NOTE — Telephone Encounter (Signed)
Patient PE/DVT is followed by his PCP per Dr. Servando Salina note 3/22.  No information available for [place] as to severity or cause.  Will need to reach out to PCP for clearance

## 2021-06-18 NOTE — Telephone Encounter (Signed)
Clearance notes re-faxed to [phone] which is the fax # on clearance request already.

## 2021-06-18 NOTE — Telephone Encounter (Signed)
  Pt requested to refax clearance, he said neuro and spine did not receive it yet, he said to refax it to [phone] and ATTN: [place]rie Noe or [employer]

## 2021-06-19 ENCOUNTER — Other Ambulatory Visit: Payer: Self-pay | Admitting: Neurosurgery

## 2021-06-30 NOTE — Progress Notes (Signed)
Surgical Instructions    Your procedure is scheduled on Friday, December 23rd.  Report to [facility] Main Entrance "A" at 5:30 A.M., then check in with the Admitting office.  Call this number if you have problems the morning of surgery:  [phone]   If you have any questions prior to your surgery date call [phone]: Open Monday-Friday 8am-4pm    Remember:  Do not eat or drink after midnight the night before your surgery     Take these medicines the morning of surgery with A SIP OF WATER   Breztri Inhaler  Lithium Carbonate    If needed:  Tylenol   Albuterol inhaler - bring with you on day of surgery  Oxycodone-Acetaminophen  Follow your surgeon's instructions on when to stop Eliquis.  If no instructions were given by your surgeon then you will need to call the office to get those instructions.    As of today, STOP taking any Aspirin (unless otherwise instructed by your surgeon) Aleve, Naproxen, Ibuprofen, Motrin, Advil, Goody's, BC's, all herbal medications, fish oil, and all vitamins.   After your COVID test   You are not required to quarantine however you are required to wear a well-fitting mask when you are out and around people not in your household.  If your mask becomes wet or soiled, replace with a new one.  Wash your hands often with soap and water for 20 seconds or clean your hands with an alcohol-based hand sanitizer that contains at least 60% alcohol.  Do not share personal items.  Notify your provider: if you are in close contact with someone who has COVID  or if you develop a fever of 100.4 or greater, sneezing, cough, sore throat, shortness of breath or body aches.   DAY OF SURGERY:         Do not wear jewelry  Do not wear lotions, powders, colognes, or deodorant. Men may shave face and neck. Do not bring valuables to the hospital.             [facility] is not responsible for any belongings or valuables.  Do NOT Smoke (Tobacco/Vaping)  24 hours  prior to your procedure  If you use a CPAP at night, you may bring your mask for your overnight stay.   Contacts, glasses, hearing aids, dentures or partials may not be worn into surgery, please bring cases for these belongings   For patients admitted to the hospital, discharge time will be determined by your treatment team.   Patients discharged the day of surgery will not be allowed to drive home, and someone needs to stay with them for 24 hours.  NO VISITORS WILL BE ALLOWED IN PRE-OP WHERE PATIENTS ARE PREPPED FOR SURGERY.  ONLY 1 SUPPORT PERSON MAY BE PRESENT IN THE WAITING ROOM WHILE YOU ARE IN SURGERY.  IF YOU ARE TO BE ADMITTED, ONCE YOU ARE IN YOUR ROOM YOU WILL BE ALLOWED TWO (2) VISITORS. 1 (ONE) VISITOR MAY STAY OVERNIGHT BUT MUST ARRIVE TO THE ROOM BY 8pm.  Minor children may have two parents present. Special consideration for safety and communication needs will be reviewed on a case by case basis.  Special instructions:    Oral Hygiene is also important to reduce your risk of infection.  Remember - BRUSH YOUR TEETH THE MORNING OF SURGERY WITH YOUR REGULAR TOOTHPASTE   [place]- Preparing For Surgery  Before surgery, you can play an important role. Because skin is not sterile, your skin needs to  be as free of germs as possible. You can reduce the number of germs on your skin by washing with CHG (chlorahexidine gluconate) Soap before surgery.  CHG is an antiseptic cleaner which kills germs and bonds with the skin to continue killing germs even after washing.     Please do not use if you have an allergy to CHG or antibacterial soaps. If your skin becomes reddened/irritated stop using the CHG.  Do not shave (including legs and underarms) for at least 48 hours prior to first CHG shower. It is OK to shave your face.  Please follow these instructions carefully.     Shower the NIGHT BEFORE SURGERY and the MORNING OF SURGERY with CHG Soap.   If you chose to wash your hair, wash  your hair first as usual with your normal shampoo. After you shampoo, rinse your hair and body thoroughly to remove the shampoo.  Then [employer] and genitals (private parts) with your normal soap and rinse thoroughly to remove soap.  After that Use CHG Soap as you would any other liquid soap. You can apply CHG directly to the skin and wash gently with a scrungie or a clean washcloth.   Apply the CHG Soap to your body ONLY FROM THE NECK DOWN.  Do not use on open wounds or open sores. Avoid contact with your eyes, ears, mouth and genitals (private parts). Wash Face and genitals (private parts)  with your normal soap.   Wash thoroughly, paying special attention to the area where your surgery will be performed.  Thoroughly rinse your body with warm water from the neck down.  DO NOT shower/wash with your normal soap after using and rinsing off the CHG Soap.  Pat yourself dry with a CLEAN TOWEL.  Wear CLEAN PAJAMAS to bed the night before surgery  Place CLEAN SHEETS on your bed the night before your surgery  DO NOT SLEEP WITH PETS.   Day of Surgery:  Take a shower with CHG soap. Wear Clean/Comfortable clothing the morning of surgery Do not apply any deodorants/lotions.   Remember to brush your teeth WITH YOUR REGULAR TOOTHPASTE.   Please read over the following fact sheets that you were given.

## 2021-07-01 ENCOUNTER — Encounter (HOSPITAL_COMMUNITY)
Admission: RE | Admit: 2021-07-01 | Discharge: 2021-07-01 | Disposition: A | Discharge status: Home / Self Care | Payer: Medicare Other | Source: Ambulatory Visit | Attending: Neurosurgery | Admitting: Neurosurgery

## 2021-07-01 ENCOUNTER — Other Ambulatory Visit: Payer: Self-pay

## 2021-07-01 ENCOUNTER — Encounter (HOSPITAL_COMMUNITY): Payer: Self-pay

## 2021-07-01 VITALS — BP 122/68 | HR 87 | Temp 98.4°F | Resp 18 | Ht 69.0 in | Wt 258.0 lb

## 2021-07-01 DIAGNOSIS — I10 Essential (primary) hypertension: Secondary | ICD-10-CM

## 2021-07-01 DIAGNOSIS — Z01818 Encounter for other preprocedural examination: Secondary | ICD-10-CM

## 2021-07-01 DIAGNOSIS — Z01812 Encounter for preprocedural laboratory examination: Secondary | ICD-10-CM | POA: Insufficient documentation

## 2021-07-01 DIAGNOSIS — Z20822 Contact with and (suspected) exposure to covid-19: Secondary | ICD-10-CM | POA: Insufficient documentation

## 2021-07-01 HISTORY — DX: Unspecified asthma, uncomplicated: J45.909

## 2021-07-01 HISTORY — DX: Sleep apnea, unspecified: G47.30

## 2021-07-01 LAB — TYPE AND SCREEN
ABO/RH(D): O POS
Antibody Screen: NEGATIVE

## 2021-07-01 LAB — CBC
HCT: 45.8 % (ref 39.0–52.0)
Hemoglobin: 14.6 g/dL (ref 13.0–17.0)
MCH: 30.7 pg (ref 26.0–34.0)
MCHC: 31.9 g/dL (ref 30.0–36.0)
MCV: 96.2 fL (ref 80.0–100.0)
Platelets: 256 10*3/uL (ref 150–400)
RBC: 4.76 MIL/uL (ref 4.22–5.81)
RDW: 14.8 % (ref 11.5–15.5)
WBC: 9.3 10*3/uL (ref 4.0–10.5)
nRBC: 0 % (ref 0.0–0.2)

## 2021-07-01 LAB — BASIC METABOLIC PANEL
Anion gap: 9 (ref 5–15)
BUN: 10 mg/dL (ref 6–20)
CO2: 29 mmol/L (ref 22–32)
Calcium: 9.6 mg/dL (ref 8.9–10.3)
Chloride: 100 mmol/L (ref 98–111)
Creatinine, Ser: 1.11 mg/dL (ref 0.61–1.24)
GFR, Estimated: >60 mL/min (ref >60)
Glucose, Bld: 173 mg/dL — ABNORMAL HIGH (ref 70–99)
Potassium: 3.5 mmol/L (ref 3.5–5.1)
Sodium: 138 mmol/L (ref 135–145)

## 2021-07-01 LAB — SURGICAL PCR SCREEN
MRSA, PCR: NEGATIVE
Staphylococcus aureus: NEGATIVE

## 2021-07-01 LAB — SARS CORONAVIRUS 2 (TAT 6-24 HRS): SARS Coronavirus 2: NEGATIVE

## 2021-07-01 NOTE — Progress Notes (Signed)
PCP - Dr. Adela Glimpse with [facility] in [place], [place] Cardiologist - Dr. Tawanna Cooler in [place] only saw once for a blood clot. Was admitted to [facility] before seeing her.    Chest x-ray - Not indicated EKG - 10/17/20 Stress Test - Been a while no f/u after ECHO - 10/17/20  Sleep Study - Has OSA CPAP - Nightly  DM - Denies  Blood Thinner Instructions:Eliquis Surgeon instructed to stop 5-7 days  COVID TEST- 07/01/21   Anesthesia review: No  Patient denies shortness of breath, fever, cough and chest pain at PAT appointment   All instructions explained to the patient, with a verbal understanding of the material. Patient agrees to go over the instructions while at home for a better understanding. Patient also instructed to wear a mask while in public after being tested for COVID-19. The opportunity to ask questions was provided.

## 2021-07-02 NOTE — Anesthesia Preprocedure Evaluation (Addendum)
Anesthesia Evaluation  Patient identified by MRN, date of birth, ID band Patient awake    Reviewed: Allergy & Precautions, NPO status , Patient's Chart, lab work & pertinent test results  Airway Mallampati: I  TM Distance: >3 FB Neck ROM: Full    Dental no notable dental hx. (+) Edentulous Lower, Edentulous Upper   Pulmonary asthma , Current Smoker and Patient abstained from smoking.,    Pulmonary exam normal breath sounds clear to auscultation       Cardiovascular hypertension, Pt. on medications Normal cardiovascular exam Rhythm:Regular Rate:Normal     Neuro/Psych Anxiety Depression  Neuromuscular disease    GI/Hepatic Neg liver ROS,   Endo/Other    Renal/GU Lab Results      Component                Value               Date                      CREATININE               1.11                07/01/2021                K                        3.5                 07/01/2021                         Musculoskeletal  (+) Arthritis ,   Abdominal (+) + obese (BMI 38.10),   Peds  Hematology Lab Results      Component                Value               Date                      WBC                      9.3                 07/01/2021                HGB                      14.6                07/01/2021                HCT                      45.8                07/01/2021                MCV                      96.2                07/01/2021                PLT  256                 07/01/2021              Anesthesia Other Findings ALL: Duloxetine, Gabapentin, lyrica  Reproductive/Obstetrics                            Anesthesia Physical Anesthesia Plan  ASA: 3  Anesthesia Plan: General   Post-op Pain Management: Ketamine IV and Dilaudid IV   Induction: Intravenous  PONV Risk Score and Plan: 2 and Treatment may vary due to age or medical condition, Midazolam and  Ondansetron  Airway Management Planned: Oral ETT  Additional Equipment: None  Intra-op Plan:   Post-operative Plan: Extubation in OR  Informed Consent: I have reviewed the patients History and Physical, chart, labs and discussed the procedure including the risks, benefits and alternatives for the proposed anesthesia with the patient or authorized representative who has indicated his/her understanding and acceptance.     Dental advisory given  Plan Discussed with: CRNA and Anesthesiologist  Anesthesia Plan Comments: (GA + ketamine)       Anesthesia Quick Evaluation

## 2021-07-03 ENCOUNTER — Encounter (HOSPITAL_COMMUNITY): Payer: Self-pay | Admitting: Neurosurgery

## 2021-07-03 ENCOUNTER — Ambulatory Visit (HOSPITAL_COMMUNITY): Payer: Medicare Other | Admitting: Anesthesiology

## 2021-07-03 ENCOUNTER — Other Ambulatory Visit: Payer: Self-pay

## 2021-07-03 ENCOUNTER — Ambulatory Visit (HOSPITAL_COMMUNITY): Payer: Medicare Other

## 2021-07-03 ENCOUNTER — Inpatient Hospital Stay (HOSPITAL_COMMUNITY)
Admission: RE | Admit: 2021-07-03 | Discharge: 2021-07-04 | DRG: 460 | Disposition: A | Discharge status: Home / Self Care | Payer: Medicare Other | Source: Ambulatory Visit | Attending: Neurosurgery | Admitting: Neurosurgery

## 2021-07-03 ENCOUNTER — Encounter (HOSPITAL_COMMUNITY)
Admission: RE | Disposition: A | Discharge status: Home / Self Care | Payer: Self-pay | Source: Ambulatory Visit | Attending: Neurosurgery

## 2021-07-03 DIAGNOSIS — Z833 Family history of diabetes mellitus: Secondary | ICD-10-CM | POA: Diagnosis not present

## 2021-07-03 DIAGNOSIS — Z96652 Presence of left artificial knee joint: Secondary | ICD-10-CM | POA: Diagnosis present

## 2021-07-03 DIAGNOSIS — G4733 Obstructive sleep apnea (adult) (pediatric): Secondary | ICD-10-CM | POA: Diagnosis present

## 2021-07-03 DIAGNOSIS — E785 Hyperlipidemia, unspecified: Secondary | ICD-10-CM | POA: Diagnosis present

## 2021-07-03 DIAGNOSIS — Z8249 Family history of ischemic heart disease and other diseases of the circulatory system: Secondary | ICD-10-CM

## 2021-07-03 DIAGNOSIS — Z7951 Long term (current) use of inhaled steroids: Secondary | ICD-10-CM | POA: Diagnosis not present

## 2021-07-03 DIAGNOSIS — M549 Dorsalgia, unspecified: Secondary | ICD-10-CM | POA: Diagnosis present

## 2021-07-03 DIAGNOSIS — M4316 Spondylolisthesis, lumbar region: Secondary | ICD-10-CM | POA: Diagnosis present

## 2021-07-03 DIAGNOSIS — Z20822 Contact with and (suspected) exposure to covid-19: Secondary | ICD-10-CM | POA: Diagnosis present

## 2021-07-03 DIAGNOSIS — F17210 Nicotine dependence, cigarettes, uncomplicated: Secondary | ICD-10-CM | POA: Diagnosis present

## 2021-07-03 DIAGNOSIS — Z981 Arthrodesis status: Secondary | ICD-10-CM

## 2021-07-03 DIAGNOSIS — J45909 Unspecified asthma, uncomplicated: Secondary | ICD-10-CM | POA: Diagnosis present

## 2021-07-03 DIAGNOSIS — M5136 Other intervertebral disc degeneration, lumbar region: Secondary | ICD-10-CM | POA: Diagnosis present

## 2021-07-03 DIAGNOSIS — Z7901 Long term (current) use of anticoagulants: Secondary | ICD-10-CM | POA: Diagnosis not present

## 2021-07-03 DIAGNOSIS — Z888 Allergy status to other drugs, medicaments and biological substances status: Secondary | ICD-10-CM

## 2021-07-03 DIAGNOSIS — Z79899 Other long term (current) drug therapy: Secondary | ICD-10-CM | POA: Diagnosis not present

## 2021-07-03 DIAGNOSIS — Z419 Encounter for procedure for purposes other than remedying health state, unspecified: Secondary | ICD-10-CM

## 2021-07-03 DIAGNOSIS — M48061 Spinal stenosis, lumbar region without neurogenic claudication: Secondary | ICD-10-CM | POA: Diagnosis present

## 2021-07-03 SURGERY — POSTERIOR LUMBAR FUSION 1 LEVEL
Anesthesia: General | Site: Back

## 2021-07-03 MED ORDER — MIDAZOLAM HCL 2 MG/2ML IJ SOLN
INTRAMUSCULAR | Status: AC
  Filled 2021-07-03: qty 2

## 2021-07-03 MED ORDER — SUGAMMADEX SODIUM 200 MG/2ML IV SOLN
INTRAVENOUS | Status: DC | PRN
  Administered 2021-07-03: 300 mg via INTRAVENOUS

## 2021-07-03 MED ORDER — ONDANSETRON HCL 4 MG/2ML IJ SOLN
INTRAMUSCULAR | Status: AC
  Filled 2021-07-03: qty 2

## 2021-07-03 MED ORDER — DIAZEPAM 5 MG PO TABS: 5.0000 mg | ORAL_TABLET | Freq: Two times a day (BID) | ORAL | Status: DC

## 2021-07-03 MED ORDER — ACETAMINOPHEN 325 MG PO TABS: 325.0000 mg | ORAL_TABLET | Freq: Four times a day (QID) | ORAL | Status: DC | PRN

## 2021-07-03 MED ORDER — ONDANSETRON HCL 4 MG/2ML IJ SOLN
INTRAMUSCULAR | Status: DC | PRN
  Administered 2021-07-03: 4 mg via INTRAVENOUS

## 2021-07-03 MED ORDER — THROMBIN 20000 UNITS EX SOLR
CUTANEOUS | Status: AC
  Filled 2021-07-03: qty 20000

## 2021-07-03 MED ORDER — ROCURONIUM BROMIDE 10 MG/ML (PF) SYRINGE
PREFILLED_SYRINGE | INTRAVENOUS | Status: DC | PRN
  Administered 2021-07-03: 40 mg via INTRAVENOUS
  Administered 2021-07-03: 80 mg via INTRAVENOUS

## 2021-07-03 MED ORDER — CHLORHEXIDINE GLUCONATE CLOTH 2 % EX PADS: 6.0000 | MEDICATED_PAD | Freq: Once | CUTANEOUS | Status: DC

## 2021-07-03 MED ORDER — BUPIVACAINE HCL (PF) 0.25 % IJ SOLN
INTRAMUSCULAR | Status: AC
  Filled 2021-07-03: qty 30

## 2021-07-03 MED ORDER — ACETAMINOPHEN 10 MG/ML IV SOLN
INTRAVENOUS | Status: AC
  Filled 2021-07-03: qty 100

## 2021-07-03 MED ORDER — ASPIRIN-ACETAMINOPHEN-CAFFEINE 500-325-65 MG PO PACK: 2.0000 | PACK | Freq: Three times a day (TID) | ORAL | Status: DC | PRN

## 2021-07-03 MED ORDER — ACETAMINOPHEN 650 MG RE SUPP: 650.0000 mg | RECTAL | Status: DC | PRN

## 2021-07-03 MED ORDER — NICOTINE 21 MG/24HR TD PT24
21.0000 mg | MEDICATED_PATCH | Freq: Every day | TRANSDERMAL | Status: DC
  Administered 2021-07-03: 16:00:00 21 mg via TRANSDERMAL
  Filled 2021-07-03: qty 1

## 2021-07-03 MED ORDER — BUPIVACAINE LIPOSOME 1.3 % IJ SUSP
INTRAMUSCULAR | Status: DC | PRN
  Administered 2021-07-03: 20 mL

## 2021-07-03 MED ORDER — THROMBIN 20000 UNITS EX SOLR
CUTANEOUS | Status: DC | PRN
  Administered 2021-07-03: 08:00:00 20 mL via TOPICAL

## 2021-07-03 MED ORDER — OXYCODONE HCL 5 MG PO TABS
5.0000 mg | ORAL_TABLET | Freq: Once | ORAL | Status: AC | PRN
  Administered 2021-07-03: 12:00:00 5 mg via ORAL

## 2021-07-03 MED ORDER — SODIUM CHLORIDE 0.9% FLUSH
3.0000 mL | Freq: Two times a day (BID) | INTRAVENOUS | Status: DC
  Administered 2021-07-03 (×2): 3 mL via INTRAVENOUS

## 2021-07-03 MED ORDER — PHENYLEPHRINE 40 MCG/ML (10ML) SYRINGE FOR IV PUSH (FOR BLOOD PRESSURE SUPPORT)
PREFILLED_SYRINGE | INTRAVENOUS | Status: AC
  Filled 2021-07-03: qty 20

## 2021-07-03 MED ORDER — LIDOCAINE-EPINEPHRINE 1 %-1:100000 IJ SOLN
INTRAMUSCULAR | Status: AC
  Filled 2021-07-03: qty 1

## 2021-07-03 MED ORDER — ACETAMINOPHEN 10 MG/ML IV SOLN
INTRAVENOUS | Status: DC | PRN
  Administered 2021-07-03: 1000 mg via INTRAVENOUS

## 2021-07-03 MED ORDER — LACTATED RINGERS IV SOLN: INTRAVENOUS | Status: DC

## 2021-07-03 MED ORDER — LISINOPRIL 20 MG PO TABS
20.0000 mg | ORAL_TABLET | Freq: Every day | ORAL | Status: DC
  Administered 2021-07-03: 16:00:00 20 mg via ORAL
  Filled 2021-07-03: qty 1

## 2021-07-03 MED ORDER — OXYCODONE-ACETAMINOPHEN 5-325 MG PO TABS: 1.0000 | ORAL_TABLET | Freq: Four times a day (QID) | ORAL | Status: DC | PRN

## 2021-07-03 MED ORDER — ACETAMINOPHEN 10 MG/ML IV SOLN: 1000.0000 mg | Freq: Once | INTRAVENOUS | Status: DC | PRN

## 2021-07-03 MED ORDER — MENTHOL 3 MG MT LOZG: 1.0000 | LOZENGE | OROMUCOSAL | Status: DC | PRN

## 2021-07-03 MED ORDER — BUPIVACAINE LIPOSOME 1.3 % IJ SUSP
INTRAMUSCULAR | Status: AC
  Filled 2021-07-03: qty 20

## 2021-07-03 MED ORDER — OXYCODONE HCL 5 MG PO TABS
ORAL_TABLET | ORAL | Status: AC
  Filled 2021-07-03: qty 1

## 2021-07-03 MED ORDER — MONTELUKAST SODIUM 10 MG PO TABS: 10.0000 mg | ORAL_TABLET | Freq: Every day | ORAL | Status: DC

## 2021-07-03 MED ORDER — CEFAZOLIN SODIUM-DEXTROSE 2-3 GM-%(50ML) IV SOLR
INTRAVENOUS | Status: DC | PRN
  Administered 2021-07-03: 2 g via INTRAVENOUS

## 2021-07-03 MED ORDER — LIDOCAINE 2% (20 MG/ML) 5 ML SYRINGE
INTRAMUSCULAR | Status: DC | PRN
  Administered 2021-07-03: 100 mg via INTRAVENOUS

## 2021-07-03 MED ORDER — KETAMINE HCL 10 MG/ML IJ SOLN
INTRAMUSCULAR | Status: DC | PRN
  Administered 2021-07-03: 30 mg via INTRAVENOUS
  Administered 2021-07-03 (×2): 10 mg via INTRAVENOUS

## 2021-07-03 MED ORDER — FENTANYL CITRATE (PF) 250 MCG/5ML IJ SOLN
INTRAMUSCULAR | Status: DC | PRN
  Administered 2021-07-03 (×5): 50 ug via INTRAVENOUS

## 2021-07-03 MED ORDER — LIDOCAINE 2% (20 MG/ML) 5 ML SYRINGE
INTRAMUSCULAR | Status: AC
  Filled 2021-07-03: qty 5

## 2021-07-03 MED ORDER — DEXAMETHASONE SODIUM PHOSPHATE 10 MG/ML IJ SOLN
INTRAMUSCULAR | Status: DC | PRN
  Administered 2021-07-03: 10 mg via INTRAVENOUS

## 2021-07-03 MED ORDER — KETAMINE HCL 50 MG/5ML IJ SOSY
PREFILLED_SYRINGE | INTRAMUSCULAR | Status: AC
  Filled 2021-07-03: qty 5

## 2021-07-03 MED ORDER — CHLORHEXIDINE GLUCONATE 0.12 % MT SOLN
15.0000 mL | Freq: Once | OROMUCOSAL | Status: AC
  Administered 2021-07-03: 06:00:00 15 mL via OROMUCOSAL
  Filled 2021-07-03: qty 15

## 2021-07-03 MED ORDER — CEFAZOLIN SODIUM-DEXTROSE 2-4 GM/100ML-% IV SOLN
2.0000 g | INTRAVENOUS | Status: DC
  Filled 2021-07-03: qty 100

## 2021-07-03 MED ORDER — PHENOL 1.4 % MT LIQD: 1.0000 | OROMUCOSAL | Status: DC | PRN

## 2021-07-03 MED ORDER — ORAL CARE MOUTH RINSE: 15.0000 mL | Freq: Once | OROMUCOSAL | Status: AC

## 2021-07-03 MED ORDER — PROPOFOL 10 MG/ML IV BOLUS
INTRAVENOUS | Status: AC
  Filled 2021-07-03: qty 20

## 2021-07-03 MED ORDER — HYDROMORPHONE HCL 1 MG/ML IJ SOLN: 0.5000 mg | INTRAMUSCULAR | Status: DC | PRN

## 2021-07-03 MED ORDER — PANTOPRAZOLE SODIUM 40 MG IV SOLR
40.0000 mg | Freq: Every day | INTRAVENOUS | Status: DC
  Administered 2021-07-03: 21:00:00 40 mg via INTRAVENOUS
  Filled 2021-07-03: qty 40

## 2021-07-03 MED ORDER — DEXAMETHASONE SODIUM PHOSPHATE 10 MG/ML IJ SOLN
INTRAMUSCULAR | Status: AC
  Filled 2021-07-03: qty 1

## 2021-07-03 MED ORDER — 0.9 % SODIUM CHLORIDE (POUR BTL) OPTIME
TOPICAL | Status: DC | PRN
  Administered 2021-07-03: 08:00:00 1000 mL

## 2021-07-03 MED ORDER — ALUM & MAG HYDROXIDE-SIMETH 200-200-20 MG/5ML PO SUSP: 30.0000 mL | Freq: Four times a day (QID) | ORAL | Status: DC | PRN

## 2021-07-03 MED ORDER — ONDANSETRON HCL 4 MG/2ML IJ SOLN: 4.0000 mg | Freq: Once | INTRAMUSCULAR | Status: DC | PRN

## 2021-07-03 MED ORDER — ONDANSETRON HCL 4 MG PO TABS: 4.0000 mg | ORAL_TABLET | Freq: Four times a day (QID) | ORAL | Status: DC | PRN

## 2021-07-03 MED ORDER — ONDANSETRON HCL 4 MG/2ML IJ SOLN: 4.0000 mg | Freq: Four times a day (QID) | INTRAMUSCULAR | Status: DC | PRN

## 2021-07-03 MED ORDER — OXYCODONE HCL 5 MG PO TABS
10.0000 mg | ORAL_TABLET | ORAL | Status: DC | PRN
  Administered 2021-07-03 – 2021-07-04 (×4): 10 mg via ORAL
  Filled 2021-07-03 (×5): qty 2

## 2021-07-03 MED ORDER — HYDROMORPHONE HCL 1 MG/ML IJ SOLN
INTRAMUSCULAR | Status: AC
  Filled 2021-07-03: qty 1

## 2021-07-03 MED ORDER — FLUTICASONE FUROATE-VILANTEROL 200-25 MCG/ACT IN AEPB
1.0000 | INHALATION_SPRAY | Freq: Every day | RESPIRATORY_TRACT | Status: DC
  Filled 2021-07-03: qty 28

## 2021-07-03 MED ORDER — PHENYLEPHRINE 40 MCG/ML (10ML) SYRINGE FOR IV PUSH (FOR BLOOD PRESSURE SUPPORT)
PREFILLED_SYRINGE | INTRAVENOUS | Status: DC | PRN
  Administered 2021-07-03: 160 ug via INTRAVENOUS
  Administered 2021-07-03 (×2): 80 ug via INTRAVENOUS
  Administered 2021-07-03: 160 ug via INTRAVENOUS

## 2021-07-03 MED ORDER — SODIUM CHLORIDE 0.9% FLUSH: 3.0000 mL | INTRAVENOUS | Status: DC | PRN

## 2021-07-03 MED ORDER — HYDROCHLOROTHIAZIDE 25 MG PO TABS
25.0000 mg | ORAL_TABLET | Freq: Every day | ORAL | Status: DC
  Administered 2021-07-03: 16:00:00 25 mg via ORAL
  Filled 2021-07-03: qty 1

## 2021-07-03 MED ORDER — BUDESON-GLYCOPYRROL-FORMOTEROL 160-9-4.8 MCG/ACT IN AERO: 2.0000 | INHALATION_SPRAY | Freq: Two times a day (BID) | RESPIRATORY_TRACT | Status: DC

## 2021-07-03 MED ORDER — ACETAMINOPHEN 325 MG PO TABS
650.0000 mg | ORAL_TABLET | ORAL | Status: DC | PRN
  Administered 2021-07-04 (×2): 650 mg via ORAL
  Filled 2021-07-03 (×2): qty 2

## 2021-07-03 MED ORDER — CYCLOBENZAPRINE HCL 10 MG PO TABS
10.0000 mg | ORAL_TABLET | Freq: Three times a day (TID) | ORAL | Status: DC | PRN
  Administered 2021-07-03: 19:00:00 10 mg via ORAL
  Filled 2021-07-03 (×2): qty 1

## 2021-07-03 MED ORDER — MIDAZOLAM HCL 2 MG/2ML IJ SOLN
INTRAMUSCULAR | Status: DC | PRN
  Administered 2021-07-03: 2 mg via INTRAVENOUS

## 2021-07-03 MED ORDER — ROCURONIUM BROMIDE 10 MG/ML (PF) SYRINGE
PREFILLED_SYRINGE | INTRAVENOUS | Status: AC
  Filled 2021-07-03: qty 20

## 2021-07-03 MED ORDER — HYDROMORPHONE HCL 1 MG/ML IJ SOLN
0.2500 mg | INTRAMUSCULAR | Status: DC | PRN
  Administered 2021-07-03 (×2): 0.5 mg via INTRAVENOUS

## 2021-07-03 MED ORDER — UMECLIDINIUM BROMIDE 62.5 MCG/ACT IN AEPB
1.0000 | INHALATION_SPRAY | Freq: Every day | RESPIRATORY_TRACT | Status: DC
  Filled 2021-07-03: qty 7

## 2021-07-03 MED ORDER — PHENYLEPHRINE HCL-NACL 20-0.9 MG/250ML-% IV SOLN
INTRAVENOUS | Status: DC | PRN
  Administered 2021-07-03: 25 ug/min via INTRAVENOUS

## 2021-07-03 MED ORDER — PRAVASTATIN SODIUM 10 MG PO TABS
10.0000 mg | ORAL_TABLET | Freq: Every day | ORAL | Status: DC
  Administered 2021-07-03: 22:00:00 10 mg via ORAL
  Filled 2021-07-03: qty 1

## 2021-07-03 MED ORDER — LISINOPRIL-HYDROCHLOROTHIAZIDE 20-25 MG PO TABS: 1.0000 | ORAL_TABLET | Freq: Every day | ORAL | Status: DC

## 2021-07-03 MED ORDER — SODIUM CHLORIDE 0.9 % IV SOLN
250.0000 mL | INTRAVENOUS | Status: DC
  Administered 2021-07-03: 16:00:00 250 mL via INTRAVENOUS

## 2021-07-03 MED ORDER — HALOPERIDOL LACTATE 2 MG/ML PO CONC
1.0000 mg | Freq: Every day | ORAL | Status: DC
  Administered 2021-07-03: 21:00:00 1 mg via ORAL
  Filled 2021-07-03 (×3): qty 0.5

## 2021-07-03 MED ORDER — AMISULPRIDE (ANTIEMETIC) 5 MG/2ML IV SOLN: 10.0000 mg | Freq: Once | INTRAVENOUS | Status: DC | PRN

## 2021-07-03 MED ORDER — FENTANYL CITRATE (PF) 250 MCG/5ML IJ SOLN
INTRAMUSCULAR | Status: AC
  Filled 2021-07-03: qty 5

## 2021-07-03 MED ORDER — PROPOFOL 10 MG/ML IV BOLUS
INTRAVENOUS | Status: DC | PRN
  Administered 2021-07-03: 140 mg via INTRAVENOUS

## 2021-07-03 MED ORDER — OXYCODONE HCL 5 MG/5ML PO SOLN: 5.0000 mg | Freq: Once | ORAL | Status: AC | PRN

## 2021-07-03 MED ORDER — LITHIUM CARBONATE 300 MG PO CAPS
600.0000 mg | ORAL_CAPSULE | Freq: Two times a day (BID) | ORAL | Status: DC
  Administered 2021-07-03: 22:00:00 600 mg via ORAL
  Filled 2021-07-03 (×3): qty 2

## 2021-07-03 MED ORDER — CEFAZOLIN SODIUM-DEXTROSE 2-4 GM/100ML-% IV SOLN
2.0000 g | Freq: Three times a day (TID) | INTRAVENOUS | Status: DC
  Administered 2021-07-03 – 2021-07-04 (×3): 2 g via INTRAVENOUS
  Filled 2021-07-03 (×3): qty 100

## 2021-07-03 MED ORDER — SILDENAFIL CITRATE 100 MG PO TABS: 100.0000 mg | ORAL_TABLET | Freq: Every day | ORAL | Status: DC | PRN

## 2021-07-03 MED ORDER — ALBUTEROL SULFATE (2.5 MG/3ML) 0.083% IN NEBU: 3.0000 mL | INHALATION_SOLUTION | Freq: Four times a day (QID) | RESPIRATORY_TRACT | Status: DC | PRN

## 2021-07-03 SURGICAL SUPPLY — 76 items
ADH SKN CLS APL DERMABOND .7 (GAUZE/BANDAGES/DRESSINGS) ×1
APL SKNCLS STERI-STRIP NONHPOA (GAUZE/BANDAGES/DRESSINGS) ×1
BAG COUNTER SPONGE SURGICOUNT (BAG) ×3 IMPLANT
BAG SPNG CNTER NS LX DISP (BAG) ×1
BAG SURGICOUNT SPONGE COUNTING (BAG) ×1
BASKET BONE COLLECTION (BASKET) ×4 IMPLANT
BENZOIN TINCTURE PRP APPL 2/3 (GAUZE/BANDAGES/DRESSINGS) ×4 IMPLANT
BLADE CLIPPER SURG (BLADE) IMPLANT
BLADE SURG 11 STRL SS (BLADE) ×4 IMPLANT
BONE VIVIGEN FORMABLE 5.4CC (Bone Implant) ×3 IMPLANT
BUR CUTTER 7.0 ROUND (BURR) ×4 IMPLANT
BUR MATCHSTICK NEURO 3.0 LAGG (BURR) ×4 IMPLANT
CANISTER SUCT 3000ML PPV (MISCELLANEOUS) ×4 IMPLANT
CAP LOCKING (Cap) ×18 IMPLANT
CAP LOCKING 5.5 CREO (Cap) IMPLANT
CARTRIDGE OIL MAESTRO DRILL (MISCELLANEOUS) ×2 IMPLANT
CLOSURE WOUND 1/2 X4 (GAUZE/BANDAGES/DRESSINGS) ×1
CNTNR URN SCR LID CUP LEK RST (MISCELLANEOUS) ×2 IMPLANT
CONT SPEC 4OZ STRL OR WHT (MISCELLANEOUS) ×3
COVER BACK TABLE 60X90IN (DRAPES) ×4 IMPLANT
DECANTER SPIKE VIAL GLASS SM (MISCELLANEOUS) ×4 IMPLANT
DERMABOND ADVANCED (GAUZE/BANDAGES/DRESSINGS) ×2
DERMABOND ADVANCED .7 DNX12 (GAUZE/BANDAGES/DRESSINGS) ×2 IMPLANT
DIFFUSER DRILL AIR PNEUMATIC (MISCELLANEOUS) ×4 IMPLANT
DRAPE C-ARM 42X72 X-RAY (DRAPES) ×10 IMPLANT
DRAPE C-ARMOR (DRAPES) IMPLANT
DRAPE HALF SHEET 40X57 (DRAPES) IMPLANT
DRAPE LAPAROTOMY 100X72X124 (DRAPES) ×4 IMPLANT
DRAPE SURG 17X23 STRL (DRAPES) ×4 IMPLANT
DRSG OPSITE 4X5.5 SM (GAUZE/BANDAGES/DRESSINGS) ×4 IMPLANT
DRSG OPSITE POSTOP 4X6 (GAUZE/BANDAGES/DRESSINGS) ×4 IMPLANT
DURAPREP 26ML APPLICATOR (WOUND CARE) ×4 IMPLANT
ELECT REM PT RETURN 9FT ADLT (ELECTROSURGICAL) ×3
ELECTRODE REM PT RTRN 9FT ADLT (ELECTROSURGICAL) ×2 IMPLANT
EVACUATOR 3/16  PVC DRAIN (DRAIN)
EVACUATOR 3/16 PVC DRAIN (DRAIN) IMPLANT
GAUZE 4X4 16PLY ~~LOC~~+RFID DBL (SPONGE) ×2 IMPLANT
GAUZE SPONGE 4X4 12PLY STRL (GAUZE/BANDAGES/DRESSINGS) ×4 IMPLANT
GLOVE EXAM NITRILE XL STR (GLOVE) IMPLANT
GLOVE SURG ENC MOIS LTX SZ7 (GLOVE) ×2 IMPLANT
GLOVE SURG ENC MOIS LTX SZ8 (GLOVE) ×8 IMPLANT
GLOVE SURG UNDER LTX SZ8.5 (GLOVE) ×8 IMPLANT
GLOVE SURG UNDER POLY LF SZ7 (GLOVE) ×2 IMPLANT
GOWN STRL REUS W/ TWL LRG LVL3 (GOWN DISPOSABLE) IMPLANT
GOWN STRL REUS W/ TWL XL LVL3 (GOWN DISPOSABLE) ×4 IMPLANT
GOWN STRL REUS W/TWL 2XL LVL3 (GOWN DISPOSABLE) ×4 IMPLANT
GOWN STRL REUS W/TWL LRG LVL3 (GOWN DISPOSABLE) ×3
GOWN STRL REUS W/TWL XL LVL3 (GOWN DISPOSABLE) ×6
GRAFT BNE MATRIX VG FRMBL MD 5 (Bone Implant) IMPLANT
KIT BASIN OR (CUSTOM PROCEDURE TRAY) ×4 IMPLANT
KIT GRAFTMAG DEL NEURO DISP (NEUROSURGERY SUPPLIES) IMPLANT
KIT INFUSE XX SMALL 0.7CC (Orthopedic Implant) ×2 IMPLANT
KIT TURNOVER KIT B (KITS) ×4 IMPLANT
MILL MEDIUM DISP (BLADE) ×4 IMPLANT
NDL HYPO 25X1 1.5 SAFETY (NEEDLE) ×2 IMPLANT
NEEDLE HYPO 25X1 1.5 SAFETY (NEEDLE) ×3 IMPLANT
NS IRRIG 1000ML POUR BTL (IV SOLUTION) ×4 IMPLANT
OIL CARTRIDGE MAESTRO DRILL (MISCELLANEOUS) ×3
PACK LAMINECTOMY NEURO (CUSTOM PROCEDURE TRAY) ×4 IMPLANT
PAD ARMBOARD 7.5X6 YLW CONV (MISCELLANEOUS) ×10 IMPLANT
ROD 5.5 STRAIGHT COBALT 75 (Rod) ×2 IMPLANT
ROD 75MM SPINAL (Rod) ×2 IMPLANT
SHAFT CREO 30MM (Neuro Prosthesis/Implant) ×4 IMPLANT
SPACER SUSTAIN RT 12 8D 9X26 (Spacer) ×4 IMPLANT
SPONGE SURGIFOAM ABS GEL 100 (HEMOSTASIS) ×4 IMPLANT
SPONGE T-LAP 4X18 ~~LOC~~+RFID (SPONGE) ×2 IMPLANT
STRIP CLOSURE SKIN 1/2X4 (GAUZE/BANDAGES/DRESSINGS) ×5 IMPLANT
SUT VIC AB 0 CT1 18XCR BRD8 (SUTURE) ×4 IMPLANT
SUT VIC AB 0 CT1 8-18 (SUTURE) ×3
SUT VIC AB 2-0 CT1 18 (SUTURE) ×4 IMPLANT
SUT VIC AB 4-0 PS2 27 (SUTURE) ×4 IMPLANT
TOWEL GREEN STERILE (TOWEL DISPOSABLE) ×4 IMPLANT
TOWEL GREEN STERILE FF (TOWEL DISPOSABLE) ×4 IMPLANT
TRAY FOLEY MTR SLVR 16FR STAT (SET/KITS/TRAYS/PACK) ×4 IMPLANT
TULIP CREP AMP 5.5MM (Orthopedic Implant) ×4 IMPLANT
WATER STERILE IRR 1000ML POUR (IV SOLUTION) ×4 IMPLANT

## 2021-07-03 NOTE — Op Note (Signed)
Preoperative diagnosis: Lumbar degenerative disc disease L1-L2 grade 1 spondylolisthesis L1-L2 severe spinal stenosis L1-L2 and instability segmentally L1-L2  Postoperative diagnosis: Same  Procedure: #1 decompressive lumbar laminectomy with complete medial facetectomies and radical foraminotomies at L1-L2 in excess and requiring more work than would be needed with a standard interbody fusion.  2.  Posterior lumbar interbody fusion L1-L2 utilizing the globus insert and rotate titanium cages macro mixed with locally harvested autograft vision and BMP.  3.  Cortical screw fixation L1-L3 utilizing the globus Creo amp modular cortical screw set with new screws placed at L1 tying into previously placed screws at L2-3  4.  Exploration fusion removal of old knots and rods at L2-3  Surgeon: Jillyn Hidden Audra Kagel  Assistant: Julien Girt  Anesthesia: General  EBL: Minimal  HPI: 60 year old gentleman longstanding history of issues with the back previous L2-S1 fusion over successive operations presents with worsening back bilateral hip pain and imaging revealing segmental degeneration above his fusion L1-L2 with vacuum disc phenomena a grade 1 spondylolisthesis and diastases of his facets.  Due to patient's progression of clinical syndrome imaging findings and failed conservative treatment I recommended extension of his fusion with decompression at L1-L2.  I extensively went over the risks and benefits of that operation with him as well as perioperative course expectations of outcome and alternatives of surgery and he understands and agrees to proceed forward.  Operative procedure: Patient was brought into the OR was [facility] general anesthesia positioned prone on the Wilson frame his back was prepped and draped in routine sterile fashion.  The superior aspect of his old incision was opened up and scar tissue and subperiosteal dissection was carried lamina of L1 the facet joints and the L1 cortical screw entry points  were all exposed I then exposed the old hardware at L2-3.  The fusion at L2-3 did appear to be solid I did disconnect the knots remove the rods and freed up the screw heads.  Then I remove the spinous process of L1 begin the central decompression there was marked hourglass compression of thecal sac primarily at the level of the disc base with some epidural fibrosis I freed the dura up from the scar and medial facet joints aggressively under bit facet joint decompressing the L2 nerve root flush with the pedicle as well as entire removal of the inferior medial facet below and through the pars and performing complete decompressive foraminotomies of the L1 nerve roots.  I aggressively under bit the supratrochlear facet to gain access to the lateral margin of disc base then coagulated the epidural veins removed the disc prepared the endplates and under fluoroscopy inserted bilateral cages with extensive mount of autograft in the cages and between the cages.  This opened up the disc base and also unroofed and opened up the L1 foramen.  Then under fluoroscopy placed 2 cortical screws at L1 both screws with excellent purchase postop fluoroscopy confirmed good position of the implants copiously irrigated the wound meticulous hemostasis was maintained Gelfoam was ON top of dura rods were placed all knots were anchored in place the L1 screw was compressed against L2 and then I placed a medium Hemovac drain and closed the wound in layers with interrupted Vicryl skin was closed running 4 subcuticular Dermabond benzoin Steri-Strips and a sterile dressing was applied patient recovery in stable condition.  At the end the case all needle count sponge counts were correct.

## 2021-07-03 NOTE — H&P (Signed)
Peter [name] is an 60 y.o. male.   Chief Complaint: Back and left great and right leg pain HPI: 60 year old gentleman previous L2-S1 fusion over successive operations presents with progressive worsening back and primarily left hip and anterior groin pain work-up is revealed segmental generative degeneration above his fusion at L1-L2.  Due to patient's failed conservative treatment imaging findings and progression of clinical syndrome I recommended decompressive laminectomy and interbody fusion at L1-L2 connecting up to his old hardware.  I extensively gone over the risks and benefits of that operation with him as well as perioperative course expectations of outcome and alternatives of surgery and he understands and agrees to proceed forward.  Past Medical History:  Diagnosis Date   Anxiety    Arthritis    lower back   Asthma    Bipolar disorder (HCC)    Cervical radiculopathy 02/22/2012   Closed fracture of one rib of right side 12/05/2019   Formatting of this note might be different from the original. Added automatically from request for surgery [id number]   Depression    Diaphragmatic hernia without obstruction and without gangrene 07/10/2019   Formatting of this note might be different from the original. Added automatically from request for surgery [id number]   ED (erectile dysfunction)    Essential (primary) hypertension    Hyperlipidemia    Lumbago with sciatica    Both sides   Obesity due to excess calories    Radiculopathy 12/20/2013   Sleep apnea    Spinal stenosis of lumbar region 11/08/2018   Tobacco use disorder 12/14/2019   Wears dentures     Past Surgical History:  Procedure Laterality Date   ANTERIOR CERVICAL DECOMP/DISCECTOMY FUSION  03/01/2012   Procedure: ANTERIOR CERVICAL DECOMPRESSION/DISCECTOMY FUSION 2 LEVELS;  Surgeon: Venita Lick, MD;  Location: [facility] OR;  Service: Orthopedics;  Laterality: N/A;  ACDF C5-7   ANTERIOR LAT LUMBAR FUSION Left 12/20/2013   Procedure:  ANTERIOR LATERAL LUMBAR FUSION 1 LEVEL;  Surgeon: Emilee Hero, MD;  Location: [facility] OR;  Service: Orthopedics;  Laterality: Left;  Left lumbar 3-4 extreme left  lateral interbody fusion with instrumentation and allograft   back fusion     BACK SURGERY     x 2 - lower back   HERNIA REPAIR     JOINT REPLACEMENT     lt knee   acl   LATERAL INTERBODY WITH INSTRUMENTATION L 3-4  12/20/2013   WRIST SURGERY      Family History  Problem Relation Age of Onset   Heart disease Mother    Hypertension Mother    Heart disease Father    Hypertension Father    Diabetes Father    Social History:  reports that he has been smoking cigarettes. He has a 45.00 pack-year smoking history. He has never used smokeless tobacco. He reports current alcohol use. He reports that he does not use drugs.  Allergies:  Allergies  Allergen Reactions   Duloxetine Anaphylaxis and Itching    Trouble Breathing   Gabapentin Anaphylaxis, Shortness Of Breath and Rash    UNSPECIFIED REACTION BUT WILL BE CAUTIOUS DUE TO PREGABALIN > SHORTNESS OF BREATH, RASH   Lyrica [Pregabalin] Anaphylaxis, Shortness Of Breath and Rash    Medications Prior to Admission  Medication Sig Dispense Refill   acetaminophen (TYLENOL) 325 MG tablet Take 2 tablets (650 mg total) by mouth every 6 (six) hours as needed for mild pain or moderate pain. (Patient taking differently: Take 325-650 mg by mouth every  6 (six) hours as needed for mild pain or moderate pain.)     albuterol (VENTOLIN HFA) 108 (90 Base) MCG/ACT inhaler Inhale 2 puffs into the lungs every 6 (six) hours as needed for wheezing or shortness of breath.     Aspirin-Acetaminophen-Caffeine (GOODYS EXTRA STRENGTH) [phone] MG PACK Take 2 Packages by mouth 3 (three) times daily as needed (back pain).     Budeson-Glycopyrrol-Formoterol (BREZTRI AEROSPHERE) 160-9-4.8 MCG/ACT AERO Inhale 2 puffs into the lungs 2 (two) times daily.     diazepam (VALIUM) 5 MG tablet Take 5 mg by mouth  2 (two) times daily.     haloperidol (HALDOL) 2 MG/ML solution Take 1 mg by mouth at bedtime.     lisinopril-hydrochlorothiazide (PRINZIDE,ZESTORETIC) 20-25 MG per tablet Take 1 tablet by mouth daily.     lithium carbonate 300 MG capsule Take 600 mg by mouth 2 (two) times daily.     lovastatin (MEVACOR) 20 MG tablet Take 20 mg by mouth at bedtime.     montelukast (SINGULAIR) 10 MG tablet Take 10 mg by mouth at bedtime.     nicotine (NICODERM CQ - DOSED IN MG/24 HOURS) 21 mg/24hr patch Place 21 mg onto the skin daily.     oxyCODONE-acetaminophen (PERCOCET/ROXICET) 5-325 MG tablet Take 1 tablet by mouth every 6 (six) hours as needed for severe pain.     apixaban (ELIQUIS) 5 MG TABS tablet Take 5 mg by mouth 2 (two) times daily.     polyethylene glycol (MIRALAX / GLYCOLAX) 17 g packet Take 17 g by mouth daily. (Patient not taking: Reported on 06/24/2021) 14 each 0   sildenafil (VIAGRA) 100 MG tablet Take 100 mg by mouth daily as needed for erectile dysfunction.      Results for orders placed or performed during the hospital encounter of 07/01/21 (from the past 48 hour(s))  Surgical pcr screen     Status: None   Collection Time: 07/01/21  9:21 AM   Specimen: Nasal Mucosa; Nasal Swab  Result Value Ref Range   MRSA, PCR NEGATIVE NEGATIVE   Staphylococcus aureus NEGATIVE NEGATIVE    Comment: (NOTE) The Xpert SA Assay (FDA approved for NASAL specimens in patients 50 years of age and older), is one component of a comprehensive surveillance program. It is not intended to diagnose infection nor to guide or monitor treatment. Performed at [facility], [address], [place], [place] [address]   SARS CORONAVIRUS 2 (TAT 6-24 HRS) Nasopharyngeal Nasopharyngeal Swab     Status: None   Collection Time: 07/01/21  9:21 AM   Specimen: Nasopharyngeal Swab  Result Value Ref Range   SARS Coronavirus 2 NEGATIVE NEGATIVE    Comment: (NOTE) SARS-CoV-2 target nucleic acids are NOT DETECTED.  The  SARS-CoV-2 RNA is generally detectable in upper and lower respiratory specimens during the acute phase of infection. Negative results do not preclude SARS-CoV-2 infection, do not rule out co-infections with other pathogens, and should not be used as the sole basis for treatment or other patient management decisions. Negative results must be combined with clinical observations, patient history, and epidemiological information. The expected result is Negative.  Fact Sheet for Patients: HairSlick.no  Fact Sheet for Healthcare Providers: quierodirigir.com  This test is not yet approved or cleared by the [place] FDA and  has been authorized for detection and/or diagnosis of SARS-CoV-2 by FDA under an Emergency Use Authorization (EUA). This EUA will remain  in effect (meaning this test can be used) for the duration  of the COVID-19 declaration under Se ction 564(b)(1) of the Act, 21 U.S.C. section 360bbb-3(b)(1), unless the authorization is terminated or revoked sooner.  Performed at [facility], [address] [name], [place] [address]   Type and screen [facility]     Status: None   Collection Time: 07/01/21  9:30 AM  Result Value Ref Range   ABO/RH(D) O POS    Antibody Screen NEG    Sample Expiration 07/15/2021,2359    Extend sample reason      NO TRANSFUSIONS OR PREGNANCY IN THE PAST 3 MONTHS Performed at [facility], [address] [place], [place] [address]   CBC per protocol     Status: None   Collection Time: 07/01/21 10:00 AM  Result Value Ref Range   WBC 9.3 4.0 - 10.5 K/uL   RBC 4.76 4.22 - 5.81 MIL/uL   Hemoglobin 14.6 13.0 - 17.0 g/dL   HCT 30.0 92.3 - 30.0 %   MCV 96.2 80.0 - 100.0 fL   MCH 30.7 26.0 - 34.0 pg   MCHC 31.9 30.0 - 36.0 g/dL   RDW 76.2 26.3 - 33.5 %   Platelets 256 150 - 400 K/uL   nRBC 0.0 0.0 - 0.2 %    Comment: Performed at [facility], [address], [place], [place] [address]  Basic metabolic panel per protocol     Status: Abnormal   Collection Time: 07/01/21 10:00 AM  Result Value Ref Range   Sodium 138 135 - 145 mmol/L   Potassium 3.5 3.5 - 5.1 mmol/L   Chloride 100 98 - 111 mmol/L   CO2 29 22 - 32 mmol/L   Glucose, Bld 173 (H) 70 - 99 mg/dL    Comment: Glucose reference range applies only to samples taken after fasting for at least 8 hours.   BUN 10 6 - 20 mg/dL   Creatinine, Ser 6.38 0.61 - 1.24 mg/dL   Calcium 9.6 8.9 - 93.7 mg/dL   GFR, Estimated >34 >28 mL/min    Comment: (NOTE) Calculated using the CKD-EPI Creatinine Equation (2021)    Anion gap 9 5 - 15    Comment: Performed at [facility], [address] [place], [place] [address]   No results found.  Review of Systems  Musculoskeletal:  Positive for back pain.  Neurological:  Positive for numbness.   Blood pressure (!) 174/97, pulse 88, temperature 98 F (36.7 C), temperature source Oral, resp. rate 18, height 5\' 9"  (1.753 m), weight 117 kg, SpO2 97 %. Physical Exam HENT:     Head: Normocephalic.     Right Ear: Tympanic membrane normal.     Nose: Nose normal.     Mouth/Throat:     Mouth: Mucous membranes are moist.  Cardiovascular:     Rate and Rhythm: Normal rate.  Pulmonary:     Effort: Pulmonary effort is normal.  Abdominal:     General: Abdomen is flat.  Musculoskeletal:        General: Normal range of motion.  Skin:    General: Skin is warm.  Neurological:     Mental Status: He is alert.     Comments: Strength is 5 out of 5 iliopsoas, quads, hamstrings, gastrocs, into tibialis, and EHL.     Assessment/Plan 60 year old presents for decompressive laminectomy interbody fusion L1-L2  41, MD 07/03/2021, 7:21 AM

## 2021-07-03 NOTE — Anesthesia Procedure Notes (Signed)
Procedure Name: Intubation Date/Time: 07/03/2021 8:01 AM Performed by: Bryson Corona, CRNA Pre-anesthesia Checklist: Patient identified, Emergency Drugs available, Suction available and Patient being monitored Patient Re-evaluated:Patient Re-evaluated prior to induction Oxygen Delivery Method: Circle System Utilized Preoxygenation: Pre-oxygenation with 100% oxygen Induction Type: IV induction Ventilation: Two handed mask ventilation required and Oral airway inserted - appropriate to patient size Laryngoscope Size: Mac and 4 Grade View: Grade I Tube type: Oral Tube size: 8.0 mm Number of attempts: 1 Airway Equipment and Method: Stylet and Oral airway Placement Confirmation: ETT inserted through vocal cords under direct vision, positive ETCO2 and breath sounds checked- equal and bilateral Secured at: 23 cm Tube secured with: Tape Dental Injury: Teeth and Oropharynx as per pre-operative assessment  Comments: 7.0 ETT changed to 8.0 ETT tube per Dr. Valma Cava. Atraumatic intubation. Grade 1 view both intubations with MAC 4.

## 2021-07-03 NOTE — Anesthesia Postprocedure Evaluation (Signed)
Anesthesia Post Note  Patient: Latrel E Beggs  Procedure(s) Performed: Posterior Lumbar Interbody Fusion  - Lumbar one-Lumbar two extension with globus cortical (Back)     Patient location during evaluation: PACU Anesthesia Type: General Level of consciousness: awake and alert Pain management: pain level controlled Vital Signs Assessment: post-procedure vital signs reviewed and stable Respiratory status: spontaneous breathing, nonlabored ventilation, respiratory function stable and patient connected to nasal cannula oxygen Cardiovascular status: blood pressure returned to baseline and stable Postop Assessment: no apparent nausea or vomiting Anesthetic complications: no   No notable events documented.  Last Vitals:  Vitals:   07/03/21 1135 07/03/21 1150  BP: (!) 133/91 117/64  Pulse: 79 73  Resp: 12 17  Temp:    SpO2: 97% 96%    Last Pain:  Vitals:   07/03/21 1120  TempSrc:   PainSc: Asleep                 Trevor Iha

## 2021-07-03 NOTE — Evaluation (Signed)
Physical Therapy Evaluation Patient Details Name: [name] MRN: [id number] DOB: 10-08-1960 Today's Date: 07/03/2021  History of Present Illness  The pt is a 60 yo male presenting 12/23 for PLIF of L1-2 due to chronic back pain. PMH includes: prior surgical intervention to L1-3, anxiety, bipolar disorder, depression, HTN, HLD, bilateral sciatica, obesity, and tobacco use.   Clinical Impression  Pt in bed upon arrival of PT, agreeable to evaluation at this time. Prior to admission the pt was limited to ~15 ft ambulation in his home due to pain, reports he was using SPC and taking baths due to inability to maintain standing for long enough duration to complete self-care tasks. The pt now presents with minor limitations in functional mobility and activity tolerance due to above dx and general deconditioning, but is safe to return home with family support when medically cleared. He completed 300 ft hallway ambulation without need for UE support or assist, and completed 5 steps with single UE support and no physical assist. The pt was able to verbalize good understanding of spinal precautions, progressive walking program, and reports he has all needed DME and assist from family at home. No further acute PT needs at this time.      Recommendations for follow up therapy are one component of a multi-disciplinary discharge planning process, led by the attending physician.  Recommendations may be updated based on patient status, additional functional criteria and insurance authorization.  Follow Up Recommendations Follow physician's recommendations for discharge plan and follow up therapies (discussed OPPT when cleared by MD at follow-up.)    Assistance Recommended at Discharge Intermittent Supervision/Assistance  Functional Status Assessment Patient has had a recent decline in their functional status and demonstrates the ability to make significant improvements in function in a reasonable and  predictable amount of time.  Equipment Recommendations  None recommended by PT (pt well equipped)    Recommendations for Other Services       Precautions / Restrictions Precautions Precautions: Back Precaution Booklet Issued: Yes (comment) Precaution Comments: pt recalled from prior surgery Required Braces or Orthoses:  (no brace needed) Restrictions Weight Bearing Restrictions: No      Mobility  Bed Mobility               General bed mobility comments: pt OOB in recliner at start and end of session    Transfers Overall transfer level: Needs assistance Equipment used: None Transfers: Sit to/from Stand Sit to Stand: Supervision           General transfer comment: supervision for line management, no assist given or evidence of instability with initial stand    Ambulation/Gait Ambulation/Gait assistance: Supervision Gait Distance (Feet): 300 Feet Assistive device: None Gait Pattern/deviations: Step-through pattern;Wide base of support Gait velocity: 0.57 m/s Gait velocity interpretation: 1.31 - 2.62 ft/sec, indicative of limited community ambulator   General Gait Details: pt with slow but generally stable gait with no overt LOB, reaching for single UE support with fatigue.  Stairs Stairs: Yes Stairs assistance: Supervision Stair Management: One rail Left;Step to pattern;Forwards Number of Stairs: 4 General stair comments: able to complete with either leg leading, no assist given     Balance Overall balance assessment: Mild deficits observed, not formally tested                                           Pertinent Vitals/Pain  Pain Assessment: 0-10 Pain Score: 4  Pain Location: incision Pain Descriptors / Indicators: Discomfort Pain Intervention(s): Limited activity within patient's tolerance;Monitored during session;Repositioned    Home Living Family/patient expects to be discharged to:: Private residence Living Arrangements:  Spouse/significant other Available Help at Discharge: Family;Available 24 hours/day Type of Home: House Home Access: Stairs to enter Entrance Stairs-Rails: Right;Left;Can reach both Entrance Stairs-Number of Steps: 5   Home Layout: One level Home Equipment: Agricultural consultant (2 wheels);Cane - single point;Grab bars - tub/shower;Shower seat      Prior Function Prior Level of Function : Needs assist       Physical Assist : Mobility (physical) Mobility (physical): Gait;Stairs   Mobility Comments: pt limited to 15 ft due to pain in legs, reports he has been mostly sedentary with intermittent changes in position to manage pain ADLs Comments: pt reports he completes with increased time. does not complete lower body dressing     Hand Dominance   Dominant Hand: Right    Extremity/Trunk Assessment   Upper Extremity Assessment Upper Extremity Assessment: Defer to OT evaluation    Lower Extremity Assessment Lower Extremity Assessment: Overall WFL for tasks assessed (pt able to hold against good resistance without change in pain. denies difference in sensation)    Cervical / Trunk Assessment Cervical / Trunk Assessment: Normal;Back Surgery  Communication   Communication: No difficulties  Cognition Arousal/Alertness: Awake/alert Behavior During Therapy: WFL for tasks assessed/performed Overall Cognitive Status: Within Functional Limits for tasks assessed                                          General Comments General comments (skin integrity, edema, etc.): VSS, HR 130 after mobility        Assessment/Plan    PT Assessment Patient does not need any further PT services         PT Goals (Current goals can be found in the Care Plan section)  Acute Rehab PT Goals Patient Stated Goal: return home and maintain decreased pain PT Goal Formulation: All assessment and education complete, DC therapy Time For Goal Achievement: 07/17/21 Potential to Achieve Goals:  Good     AM-PAC PT "6 Clicks" Mobility  Outcome Measure Help needed turning from your back to your side while in a flat bed without using bedrails?: A Little Help needed moving from lying on your back to sitting on the side of a flat bed without using bedrails?: A Little Help needed moving to and from a bed to a chair (including a wheelchair)?: None Help needed standing up from a chair using your arms (e.g., wheelchair or bedside chair)?: None Help needed to walk in hospital room?: A Little Help needed climbing 3-5 steps with a railing? : A Little 6 Click Score: 20    End of Session Equipment Utilized During Treatment: Gait belt Activity Tolerance: Patient tolerated treatment well Patient left: in chair;with call bell/phone within reach Nurse Communication: Mobility status PT Visit Diagnosis: Difficulty in walking, not elsewhere classified (R26.2)    Time: 5859-2924 PT Time Calculation (min) (ACUTE ONLY): 21 min   Charges:   PT Evaluation $PT Eval Low Complexity: 1 Low          Vickki Muff, PT, DPT   Acute Rehabilitation Department Pager #: [phone]  Ronnie Derby 07/03/2021, 5:01 PM

## 2021-07-03 NOTE — Transfer of Care (Signed)
Immediate Anesthesia Transfer of Care Note  Patient: [name]  Procedure(s) Performed: Posterior Lumbar Interbody Fusion  - Lumbar one-Lumbar two extension with globus cortical (Back)  Patient Location: PACU  Anesthesia Type:General  Level of Consciousness: awake, alert  and oriented  Airway & Oxygen Therapy: Patient Spontanous Breathing and Patient connected to face mask oxygen  Post-op Assessment: Report given to RN and Post -op Vital signs reviewed and stable  Post vital signs: Reviewed and stable  Last Vitals:  Vitals Value Taken Time  BP 114/43 07/03/21 1050  Temp    Pulse 70 07/03/21 1051  Resp 15 07/03/21 1051  SpO2 97 % 07/03/21 1051  Vitals shown include unvalidated device data.  Last Pain:  Vitals:   07/03/21 0557  TempSrc:   PainSc: 10-Worst pain ever         Complications: No notable events documented.

## 2021-07-03 NOTE — Progress Notes (Signed)
Pt started on CPAP with auto settings room air and full face mask. Tol well.

## 2021-07-04 MED ORDER — OXYCODONE HCL 10 MG PO TABS: 10.0000 mg | ORAL_TABLET | Freq: Four times a day (QID) | ORAL | 0 refills | Status: AC | PRN

## 2021-07-04 MED ORDER — APIXABAN 5 MG PO TABS: 5.0000 mg | ORAL_TABLET | Freq: Two times a day (BID) | ORAL | 9 refills | Status: AC

## 2021-07-04 NOTE — Progress Notes (Signed)
Patient alert and oriented, mae's well, voiding adequate amount of urine, swallowing without difficulty, no c/o pain at time of discharge. Patient discharged home with family. Script and discharged instructions given to patient. Patient and family stated understanding of instructions given. Patient has an appointment with Dr. Cram 

## 2021-07-04 NOTE — Discharge Instructions (Signed)
Wound Care  Keep the incision clean and dry remove the outer dressing in 2-3 days, Do not put any creams, lotions, or ointments on incision. Leave steri-strips on back.  They will fall off by themselves.  Activity Walk each and every day, increasing distance each day. No lifting greater than 5 lbs.  No lifting no bending no twisting no driving or riding a car unless coming back and forth to see me. If provided with back brace, wear when out of bed.  It is not necessary to wear brace in bed. Diet Resume your normal diet.   Call Your Doctor If Any of These Occur Redness, drainage, or swelling at the wound.  Temperature greater than 101 degrees. Severe pain not relieved by pain medication. Incision starts to come apart. Follow Up Appt Call today for appointment in 1-2 weeks ([phone]) or for problems.  If you have any hardware placed in your spine, you will need an x-ray before your appointment.   

## 2021-07-04 NOTE — Evaluation (Signed)
Occupational Therapy Evaluation Patient Details Name: Peter [name] MRN: [id number] DOB: October 28, 1960 Today's Date: 07/04/2021   History of Present Illness The pt is a 60 yo male presenting 12/23 for PLIF of L1-2 due to chronic back pain. PMH includes: prior surgical intervention to L1-3, anxiety, bipolar disorder, depression, HTN, HLD, bilateral sciatica, obesity, and tobacco use.   Clinical Impression   Patient is s/p L1-2 PLIF surgery resulting in functional limitations due to the deficits listed below (see OT problem list). Pt with return demo of back precautions and needs redirection to adhere. Pt reports "4th back surgery" and educated on reason for precautions. Pt reports pain is much improved and standing tolerance increased. Pt has fiance (A) upon d/c. Pt demonstrates decreased LB dressing but reports "she will help me" or flip flops as a source to maximize indep.  Patient will benefit from skilled OT acutely to increase independence and safety with ADLS to allow discharge HHOT.       Recommendations for follow up therapy are one component of a multi-disciplinary discharge planning process, led by the attending physician.  Recommendations may be updated based on patient status, additional functional criteria and insurance authorization.   Follow Up Recommendations  Home health OT    Assistance Recommended at Discharge PRN  Functional Status Assessment  Patient has had a recent decline in their functional status and demonstrates the ability to make significant improvements in function in a reasonable and predictable amount of time.  Equipment Recommendations  None recommended by OT    Recommendations for Other Services       Precautions / Restrictions Precautions Precautions: Back Precaution Comments: recalling back precautions      Mobility Bed Mobility               General bed mobility comments: oob on arrival and reports staying in the chair all night. pt  educated why laying and stretching out is important. pt states "i cant"    Transfers Overall transfer level: Needs assistance Equipment used: None Transfers: Sit to/from Stand Sit to Stand: Supervision                  Balance Overall balance assessment: Mild deficits observed, not formally tested                                         ADL either performed or assessed with clinical judgement   ADL Overall ADL's : Needs assistance/impaired Eating/Feeding: Independent   Grooming: Supervision/safety   Upper Body Bathing: Supervision/ safety   Lower Body Bathing: Moderate assistance   Upper Body Dressing : Supervision/safety   Lower Body Dressing: Moderate assistance Lower Body Dressing Details (indicate cue type and reason): pt demonstrates figure 4 corss but when given clothing attempts to bend at waist. Pt edcuated that precautions require the figure 4 practiced prior to clothing started. pt initially wanting OT to leave room for dressing but the agreeable when educated on the reason. pt educated on why precautions and positioning is important Statistician: Engineer, structural Details (indicate cue type and reason): educated that pt can NOT under any reason sit in teh bottom of the tub. OT using teach back to get pt to repeat reasons why he can not sit in the bottom of the tub to soak. pt agreeable and has a chair at home  that can be used Functional mobility during ADLs: Supervision/safety General ADL Comments: pt has fiance at home that can (A) PRN   Back handout provided and reviewed adls in detail. Pt educated on:  set an alarm at night for medication, avoid sitting for long periods of time, correct bed positioning for sleeping, correct sequence for bed mobility, avoiding lifting more than 5 pounds and never wash directly over incision. Pt has brace in room but reports "I was told not to wear it this time". All education  is complete and patient indicates understanding.   Vision Baseline Vision/History: 0 No visual deficits       Perception     Praxis      Pertinent Vitals/Pain Pain Assessment: 0-10 Pain Score: 2  Pain Location: incision Pain Descriptors / Indicators: Discomfort Pain Intervention(s): Monitored during session;Repositioned     Hand Dominance Right   Extremity/Trunk Assessment Upper Extremity Assessment Upper Extremity Assessment: Overall WFL for tasks assessed   Lower Extremity Assessment Lower Extremity Assessment: Overall WFL for tasks assessed   Cervical / Trunk Assessment Cervical / Trunk Assessment: Back Surgery   Communication Communication Communication: No difficulties   Cognition Arousal/Alertness: Awake/alert Behavior During Therapy: WFL for tasks assessed/performed Overall Cognitive Status: Impaired/Different from baseline Area of Impairment: Safety/judgement                         Safety/Judgement: Decreased awareness of deficits     General Comments: pt verbalizes back precautions with poor return demo. pt states "i can do it on my couch i have more room" " i can do it at home but just not here"     General Comments  dressing dry and intact at this time.    Exercises     Shoulder Instructions      Home Living Family/patient expects to be discharged to:: Private residence Living Arrangements: Spouse/significant other Available Help at Discharge: Family;Available 24 hours/day Type of Home: House Home Access: Stairs to enter [employer] of Steps: 5 Entrance Stairs-Rails: Right;Left;Can reach both Home Layout: One level     Bathroom Shower/Tub: Chief Strategy Officer: Standard     Home Equipment: Agricultural consultant (2 wheels);Cane - single point;Grab bars - tub/shower;Shower seat   Additional Comments: has a small mixed dog named Nayla. pt has been sitting in the bottom of the tub without any chair      Prior  Functioning/Environment Prior Level of Function : Needs assist       Physical Assist : Mobility (physical) Mobility (physical): Gait;Stairs   Mobility Comments: pt limited to 15 ft due to pain in legs, reports he has been mostly sedentary with intermittent changes in position to manage pain ADLs Comments: pt reports he completes with increased time. does not complete lower body dressing. Pt reports using flip flops due to pain and limited standing.        OT Problem List: Decreased activity tolerance;Impaired balance (sitting and/or standing);Decreased knowledge of precautions;Decreased knowledge of use of DME or AE      OT Treatment/Interventions: Self-care/ADL training;DME and/or AE instruction;Therapeutic activities;Patient/family education;Balance training    OT Goals(Current goals can be found in the care plan section) Acute Rehab OT Goals Patient Stated Goal: none stated OT Goal Formulation: With patient Time For Goal Achievement: 07/18/21 Potential to Achieve Goals: Good  OT Frequency: Min 2X/week   Barriers to D/C:            Co-evaluation  AM-PAC OT "6 Clicks" Daily Activity     Outcome Measure Help from another person eating meals?: None Help from another person taking care of personal grooming?: None Help from another person toileting, which includes using toliet, bedpan, or urinal?: None Help from another person bathing (including washing, rinsing, drying)?: A Little Help from another person to put on and taking off regular upper body clothing?: None Help from another person to put on and taking off regular lower body clothing?: A Little 6 Click Score: 22   End of Session Nurse Communication: Mobility status;Precautions  Activity Tolerance: Patient tolerated treatment well Patient left: in chair;with call bell/phone within reach (pt told to call fiance since she is 1 hour drive away to come get him)  OT Visit Diagnosis: Unsteadiness on feet  (R26.81);Muscle weakness (generalized) (M62.81)                Time: 4481-8563 OT Time Calculation (min): 15 min Charges:  OT General Charges $OT Visit: 1 Visit OT Evaluation $OT Eval Moderate Complexity: 1 Mod   Brynn, OTR/L  Acute Rehabilitation Services Pager: [phone] Office: [phone] .   Mateo Flow 07/04/2021, 8:14 AM

## 2021-07-04 NOTE — Discharge Summary (Signed)
Physician Discharge Summary  Patient ID: Peter [name] MRN: [id number] DOB/AGE: [birth year] 60 y.o.  Admit date: 07/03/2021 Discharge date: 07/04/2021  Admission Diagnoses:  Spinal stenosis, L1-2  Discharge Diagnoses:  Same Principal Problem:   Spinal stenosis of lumbar region   Discharged Condition: Stable  Hospital Course:  Peter [name] is a 60 y.o. male admitted after elective lumbar decompression and fusion.  He was at neurologic baseline postoperatively.  He had appropriate back soreness.  He was ambulating well.  Pain was under control with oral medication.  He was voiding normally.  He was therefore discharged home in stable condition.  Treatments: Surgery -L1-2 decompression and fusion  Discharge Exam: Blood pressure 119/66, pulse 86, temperature 98.4 F (36.9 C), temperature source Oral, resp. rate 18, height 5\' 9"  (1.753 m), weight 117 kg, SpO2 99 %. Awake, alert, oriented Speech fluent, appropriate CN grossly intact 5/5 BUE/BLE Wound c/d/i  Disposition: Discharge disposition: 01-Home or Self Care       Discharge Instructions     Call MD for:  redness, tenderness, or signs of infection (pain, swelling, redness, odor or green/yellow discharge around incision site)   Complete by: As directed    Call MD for:  temperature >100.4   Complete by: As directed    Diet - low sodium heart healthy   Complete by: As directed    Discharge instructions   Complete by: As directed    Walk at home as much as possible, at least 4 times / day   Increase activity slowly   Complete by: As directed    Lifting restrictions   Complete by: As directed    No lifting > 10 lbs   May shower / Bathe   Complete by: As directed    48 hours after surgery   May walk up steps   Complete by: As directed    Other Restrictions   Complete by: As directed    No bending/twisting at waist   Remove dressing in 24 hours   Complete by: As directed       Allergies as of  07/04/2021       Reactions   Duloxetine Anaphylaxis, Itching   Trouble Breathing   Gabapentin Anaphylaxis, Shortness Of Breath, Rash   UNSPECIFIED REACTION BUT WILL BE CAUTIOUS DUE TO PREGABALIN > SHORTNESS OF BREATH, RASH   Lyrica [pregabalin] Anaphylaxis, Shortness Of Breath, Rash        Medication List     STOP taking these medications    Goodys Extra Strength 500-325-65 MG Pack Generic drug: Aspirin-Acetaminophen-Caffeine   polyethylene glycol 17 g packet Commonly known as: MIRALAX / GLYCOLAX       TAKE these medications    acetaminophen 325 MG tablet Commonly known as: TYLENOL Take 2 tablets (650 mg total) by mouth every 6 (six) hours as needed for mild pain or moderate pain. What changed: how much to take   albuterol 108 (90 Base) MCG/ACT inhaler Commonly known as: VENTOLIN HFA Inhale 2 puffs into the lungs every 6 (six) hours as needed for wheezing or shortness of breath.   apixaban 5 MG Tabs tablet Commonly known as: ELIQUIS Take 1 tablet (5 mg total) by mouth 2 (two) times daily. Start taking on: July 07, 2021 What changed: These instructions start on July 07, 2021. If you are unsure what to do until then, ask your doctor or other care provider.   Breztri Aerosphere 160-9-4.8 MCG/ACT Aero Generic drug: Budeson-Glycopyrrol-Formoterol Inhale 2 puffs into the  lungs 2 (two) times daily.   diazepam 5 MG tablet Commonly known as: VALIUM Take 5 mg by mouth 2 (two) times daily.   haloperidol 2 MG/ML solution Commonly known as: HALDOL Take 1 mg by mouth at bedtime.   lisinopril-hydrochlorothiazide 20-25 MG tablet Commonly known as: ZESTORETIC Take 1 tablet by mouth daily.   lithium carbonate 300 MG capsule Take 600 mg by mouth 2 (two) times daily.   lovastatin 20 MG tablet Commonly known as: MEVACOR Take 20 mg by mouth at bedtime.   montelukast 10 MG tablet Commonly known as: SINGULAIR Take 10 mg by mouth at bedtime.   nicotine 21 mg/24hr  patch Commonly known as: NICODERM CQ - dosed in mg/24 hours Place 21 mg onto the skin daily.   Oxycodone HCl 10 MG Tabs Take 1 tablet (10 mg total) by mouth every 6 (six) hours as needed for up to 7 days for severe pain ((score 7 to 10)).   oxyCODONE-acetaminophen 5-325 MG tablet Commonly known as: PERCOCET/ROXICET Take 1 tablet by mouth every 6 (six) hours as needed for severe pain.   sildenafil 100 MG tablet Commonly known as: VIAGRA Take 100 mg by mouth daily as needed for erectile dysfunction.        Follow-up Information     Donalee Citrin, MD Follow up.   Specialty: Neurosurgery Contact information: [address] [place] [address] [phone]                 Signed: Jackelyn Hoehn 07/04/2021, 9:05 AM

## 2022-04-22 IMAGING — CT CT HEART MORP W/ CTA COR W/ SCORE W/ CA W/CM &/OR W/O CM
4 of 7 series · 9 of 20 positions shown, 10 images · non-contrast
Comparison: None.
COMPARISON: None.

Addendum:
EXAM:
OVER-READ INTERPRETATION  CT CHEST

The following report is an over-read performed by radiologist Dr.
Code X Zenda [REDACTED] on 10/16/2020. This
over-read does not include interpretation of cardiac or coronary
anatomy or pathology. The coronary calcium score/coronary CTA
interpretation by the cardiologist is attached.
CLINICAL DATA: This is a 59 year old male with chest pain.
Cardiac/Coronary CTA
TECHNIQUE: The patient was scanned on a [employer] Force scanner.

[Series 7: best diast 71 % · axial · 0.47mm/px · z∈[-319,-258]mm · 3 of 308 slices shown, 4 images]
[im 77/308  vessel]
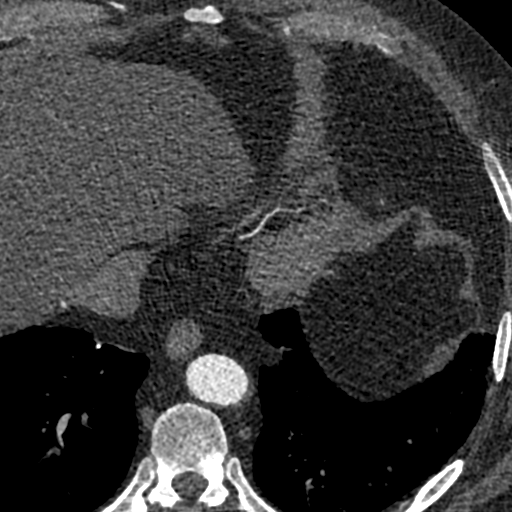
[im 77/308  lung]
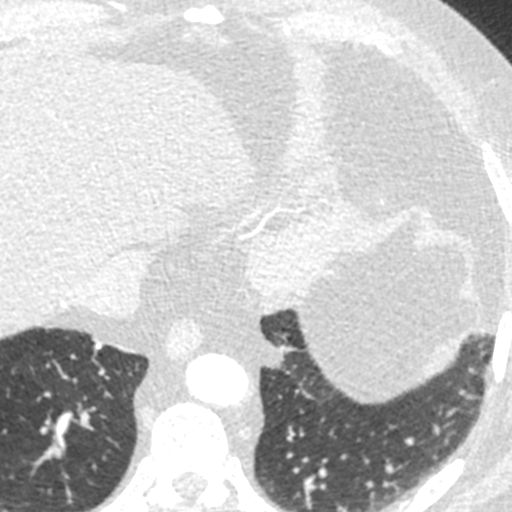
[im 154/308  vessel]
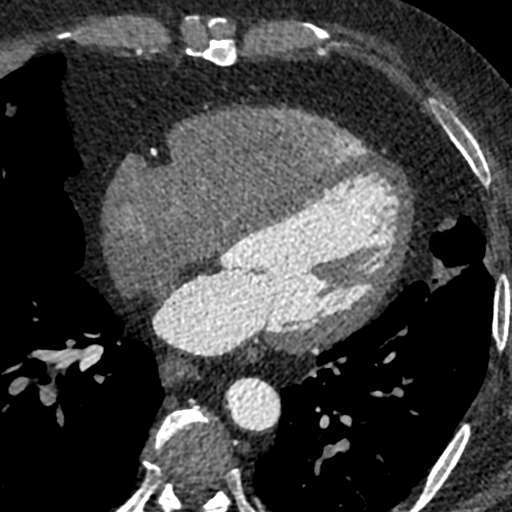
[im 231/308  vessel]
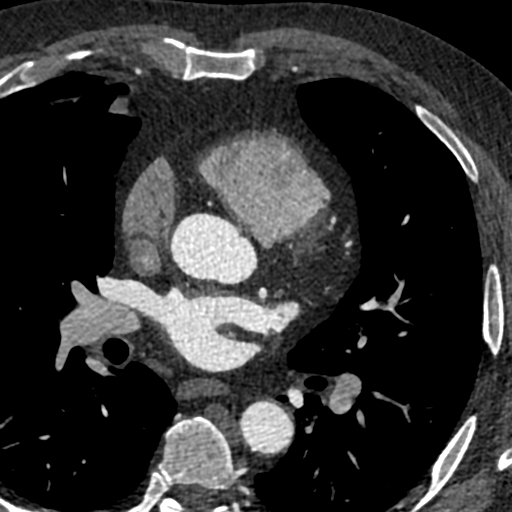

[Series 8: best syst 36 % · axial · 0.47mm/px · z∈[-301,-264]mm · 2 of 276 slices shown]
[im 92/276  vessel]
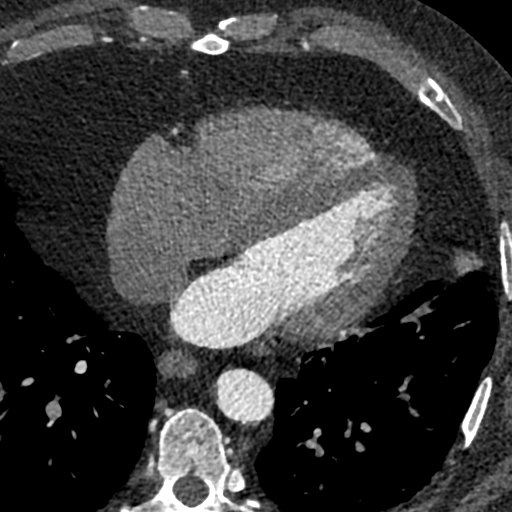
[im 184/276  vessel]
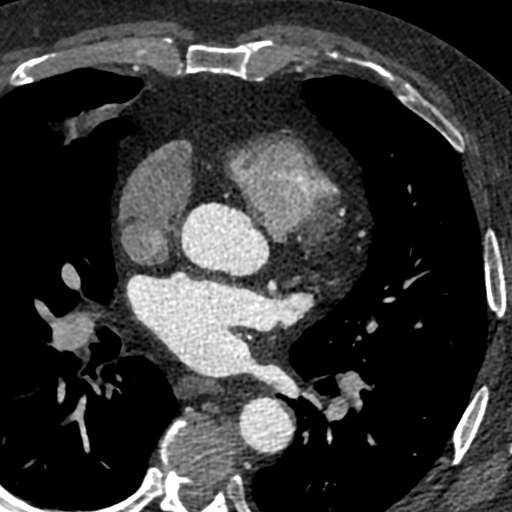

[Series 9: ts diast sharp 71 % · axial · 0.47mm/px · z∈[-301,-264]mm · 2 of 276 slices shown]
[im 92/276  lung]
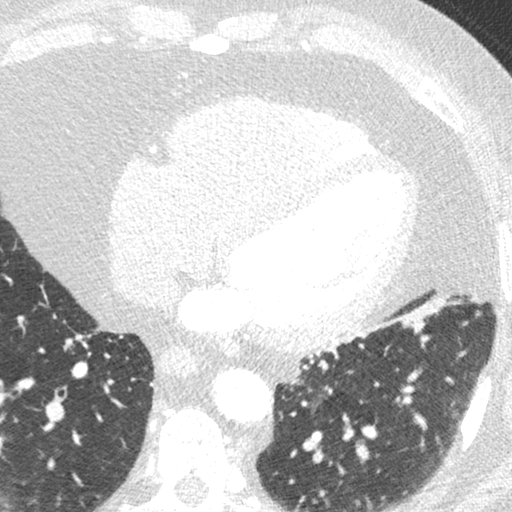
[im 184/276  lung]
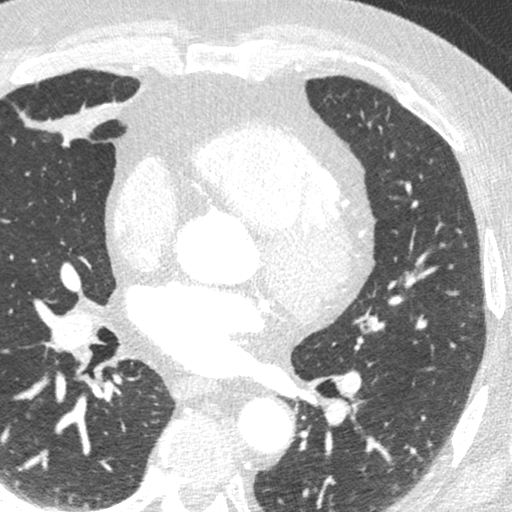

[Series 10: ts syst sharp 36 % · axial · 0.47mm/px · z∈[-301,-264]mm · 2 of 276 slices shown]
[im 92/276  lung]
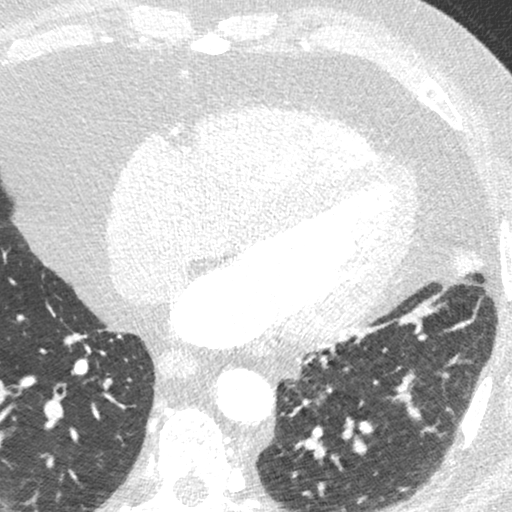
[im 184/276  lung]
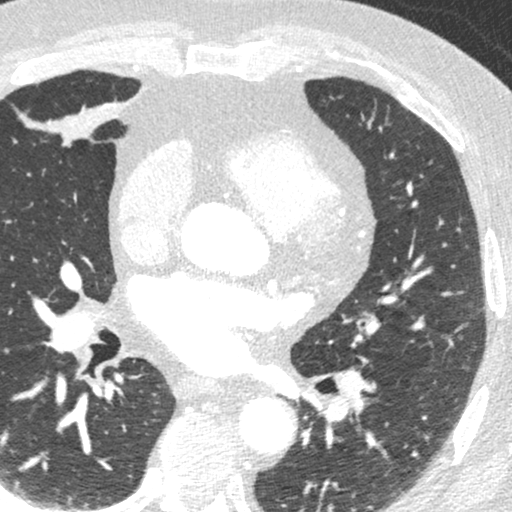

[9 of 20 positions shown; findings below may reference images not displayed]

FINDINGS: In the posterior basal segment of the right lower lobe (axial images
38-41 of series 12) there is a nonocclusive filling defect in a
segmental sized pulmonary artery, indicative of pulmonary embolus.
Within the visualized portions of the thorax there is no acute
consolidative airspace disease, no pleural effusions, no
pneumothorax and no lymphadenopathy. Postoperative changes of ORIF
are noted in a lower right thoracic rib where there is widening of
the interspace between the adjacent ribs, presumably related to
remote trauma. Some ground-glass attenuation and architectural
distortion is noted in the underlying lung, likely posttraumatic
scarring. There is also some mild scarring in the inferior aspect of
the right upper lobe and inferior segment of the lingula. 5 mm
pulmonary nodule in the periphery of the right lower lobe (axial
image 26 of series 13). No other larger more suspicious appearing
pulmonary nodules or masses are noted in the visualized portions of
the thorax. Visualized portions of the upper abdomen demonstrates
severe diffuse low attenuation throughout the visualized hepatic
parenchyma, indicative of hepatic steatosis. There are no aggressive
appearing lytic or blastic lesions noted in the visualized portions
of the skeleton.
IMPRESSION: 1. Study is positive for a nonocclusive segmental sized embolus to
the right lower lobe.
2. 5 mm right lower lobe pulmonary nodule. This is nonspecific, but
statistically likely benign. No follow-up needed if patient is
low-risk. Non-contrast chest CT can be considered in 12 months if
patient is high-risk. This recommendation follows the consensus
statement: Guidelines for Management of Incidental Pulmonary Nodules
Detected on CT Images: From the [HOSPITAL] 1016; Radiology
1016; [DATE].
3. Severe hepatic steatosis.
4. Posttraumatic and postoperative changes, as above.

These results will be called to the ordering clinician or
representative by the Radiologist Assistant, and communication
documented in the PACS or [REDACTED].
FINDINGS: A 100 kV prospective scan was triggered in the descending thoracic
aorta at 111 HU's. Axial non-contrast 3 mm slices were carried out
through the heart. The data set was analyzed on a dedicated work
station and scored using the Agatson method. Gantry rotation speed
was 250 msecs and collimation was .6 mm. No beta blockade and 0.8 mg
of sl NTG was given. The 3D data set was reconstructed in 5%
intervals of the 67-82 % of the R-R cycle. Diastolic phases were
analyzed on a dedicated work station using MPR, MIP and VRT modes.
The patient received 80 cc of contrast.

Noise artifact is moderate with mis-registration.

Aorta:  Normal size.  No calcifications.  No dissection.

Aortic Valve:  Trileaflet.  No calcifications.

Coronary Arteries:  Normal coronary origin.  Right dominance.

RCA is a large dominant artery that gives rise to PDA and PLA. There
is minimal (<24%) calcification in the proximal RCA. The mid RCA
with mis-alignment artifact can not exclude soft plaque. The distal
RCA with minimal calcified plaque.

Left main is a large artery that gives rise to LAD and LCX arteries.

LAD is a large vessel that has no plaque. The proximal LAD with
minimal calcified plaques. There is minimal calcified plaques in the
mid LAD. The mid to distal LAD with mild myocardial bridging. The
distal LAD with no plaques.

LCX is a non-dominant artery that gives rise to one large OM1
branch. There is minimal calcified plaques in the proximal LCX.
There mid and distal LCX with no plaques. Mid OM1 with mis-alignment
artifact.

Other findings:

Normal pulmonary vein drainage into the left atrium.

Normal left atrial appendage without a thrombus.

Normal size of the pulmonary artery.
IMPRESSION: 1. Coronary calcium score of 143. This was 80 percentile for age and
sex matched control.

2. Normal coronary origin with right dominance.

3. Minimal coronary artery disease. CAD-RADS 1. Minimal
non-obstructive CAD (0-24%). Consider non-atherosclerotic causes of
chest pain. Consider preventive therapy and risk factor
modification.

*** End of Addendum ***
EXAM:
OVER-READ INTERPRETATION  CT CHEST

The following report is an over-read performed by radiologist Dr.
Code X Zenda [REDACTED] on 10/16/2020. This
over-read does not include interpretation of cardiac or coronary
anatomy or pathology. The coronary calcium score/coronary CTA
interpretation by the cardiologist is attached.
FINDINGS: In the posterior basal segment of the right lower lobe (axial images
38-41 of series 12) there is a nonocclusive filling defect in a
segmental sized pulmonary artery, indicative of pulmonary embolus.
Within the visualized portions of the thorax there is no acute
consolidative airspace disease, no pleural effusions, no
pneumothorax and no lymphadenopathy. Postoperative changes of ORIF
are noted in a lower right thoracic rib where there is widening of
the interspace between the adjacent ribs, presumably related to
remote trauma. Some ground-glass attenuation and architectural
distortion is noted in the underlying lung, likely posttraumatic
scarring. There is also some mild scarring in the inferior aspect of
the right upper lobe and inferior segment of the lingula. 5 mm
pulmonary nodule in the periphery of the right lower lobe (axial
image 26 of series 13). No other larger more suspicious appearing
pulmonary nodules or masses are noted in the visualized portions of
the thorax. Visualized portions of the upper abdomen demonstrates
severe diffuse low attenuation throughout the visualized hepatic
parenchyma, indicative of hepatic steatosis. There are no aggressive
appearing lytic or blastic lesions noted in the visualized portions
of the skeleton.
IMPRESSION: 1. Study is positive for a nonocclusive segmental sized embolus to
the right lower lobe.
2. 5 mm right lower lobe pulmonary nodule. This is nonspecific, but
statistically likely benign. No follow-up needed if patient is
low-risk. Non-contrast chest CT can be considered in 12 months if
patient is high-risk. This recommendation follows the consensus
statement: Guidelines for Management of Incidental Pulmonary Nodules
Detected on CT Images: From the [HOSPITAL] 1016; Radiology
1016; [DATE].
3. Severe hepatic steatosis.
4. Posttraumatic and postoperative changes, as above.

These results will be called to the ordering clinician or
representative by the Radiologist Assistant, and communication
documented in the PACS or [REDACTED].

## 2022-04-22 IMAGING — DX DG CHEST 2V
3 series · 3 of 3 positions shown · non-contrast
Comparison: 12/13/2013

CLINICAL DATA: Chest pain and shortness of breath.

EXAM:
CHEST - 2 VIEW

[chest pa (1 of 2)]
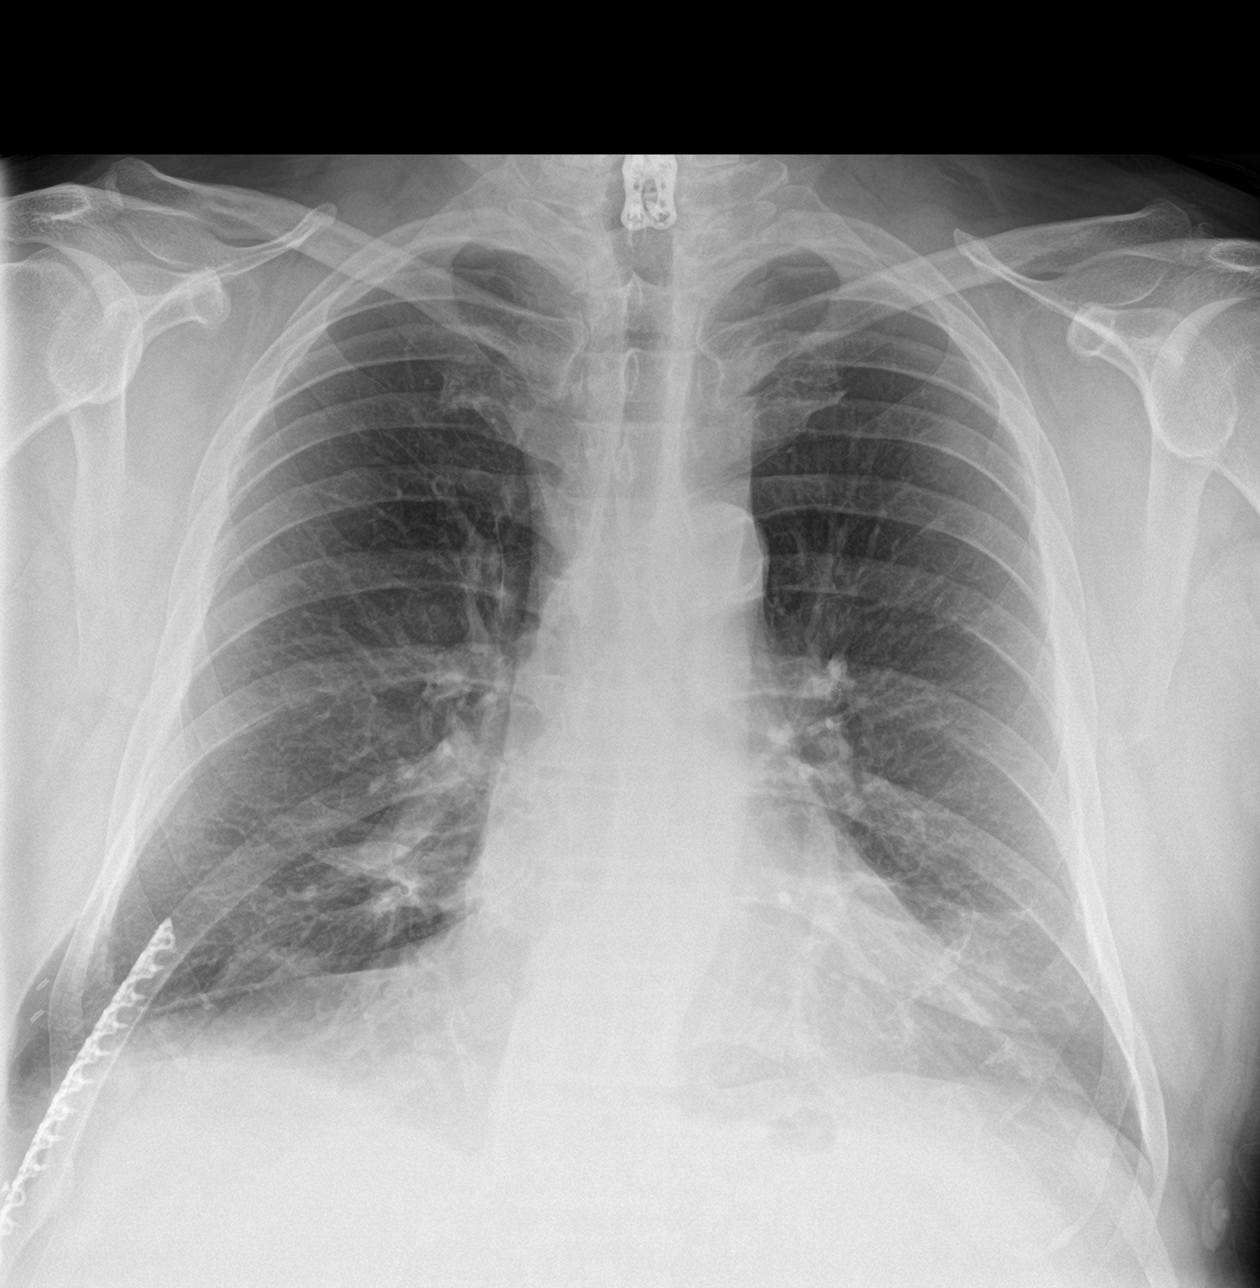

[chest lat]
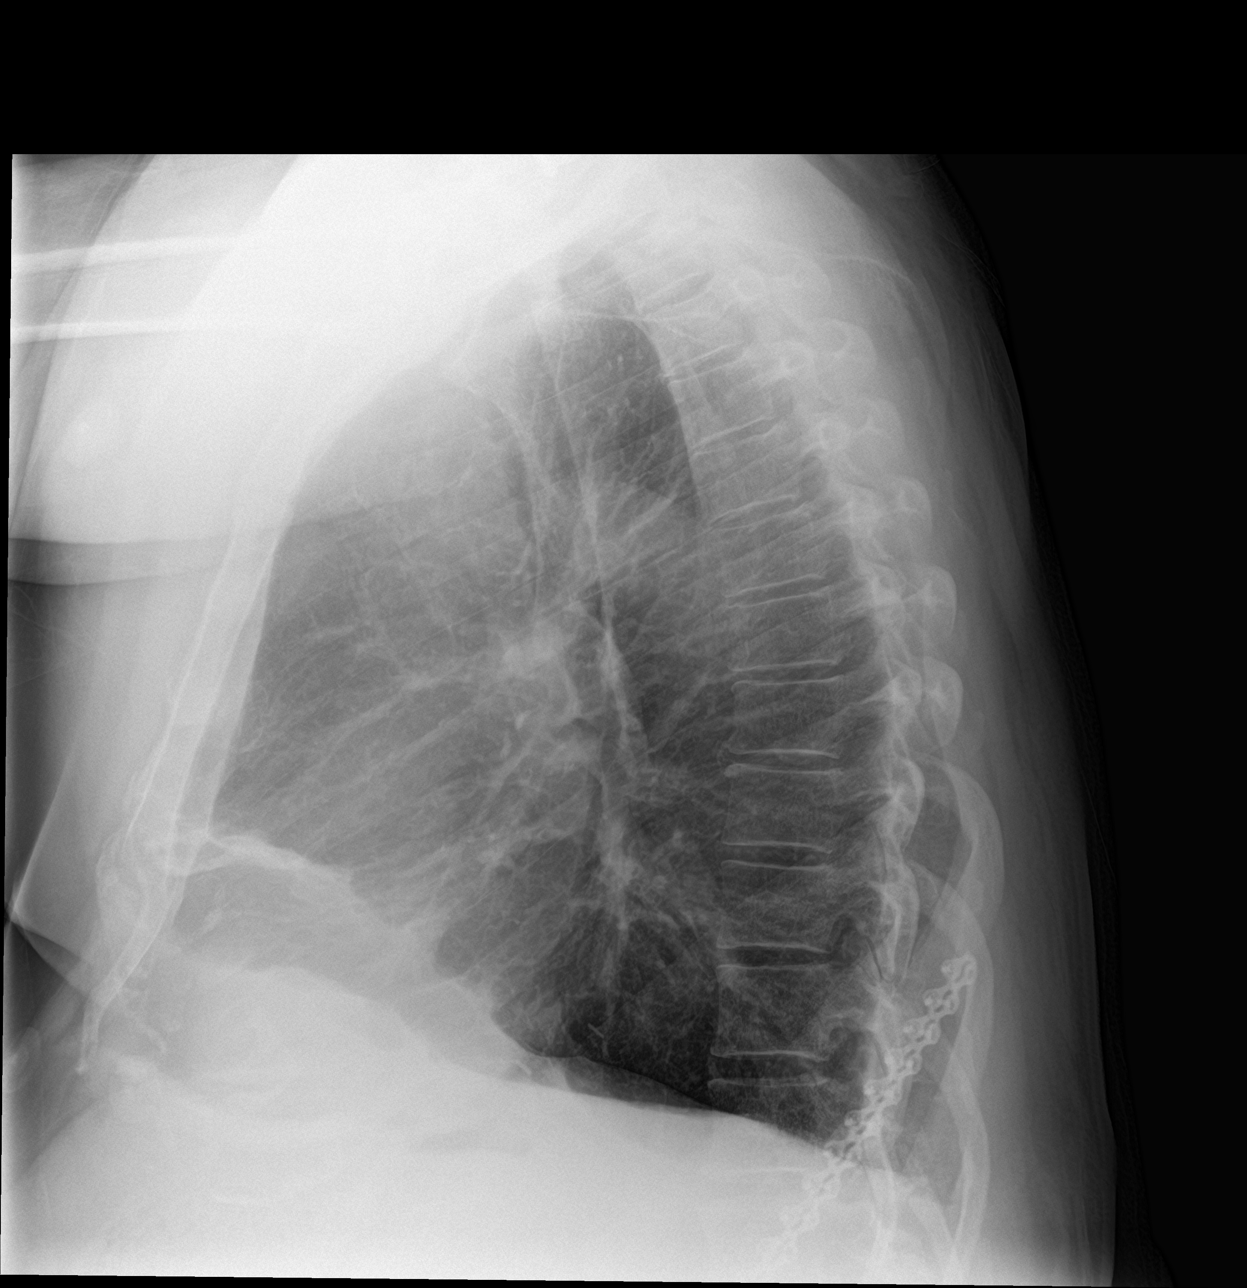

[chest pa (2 of 2)]
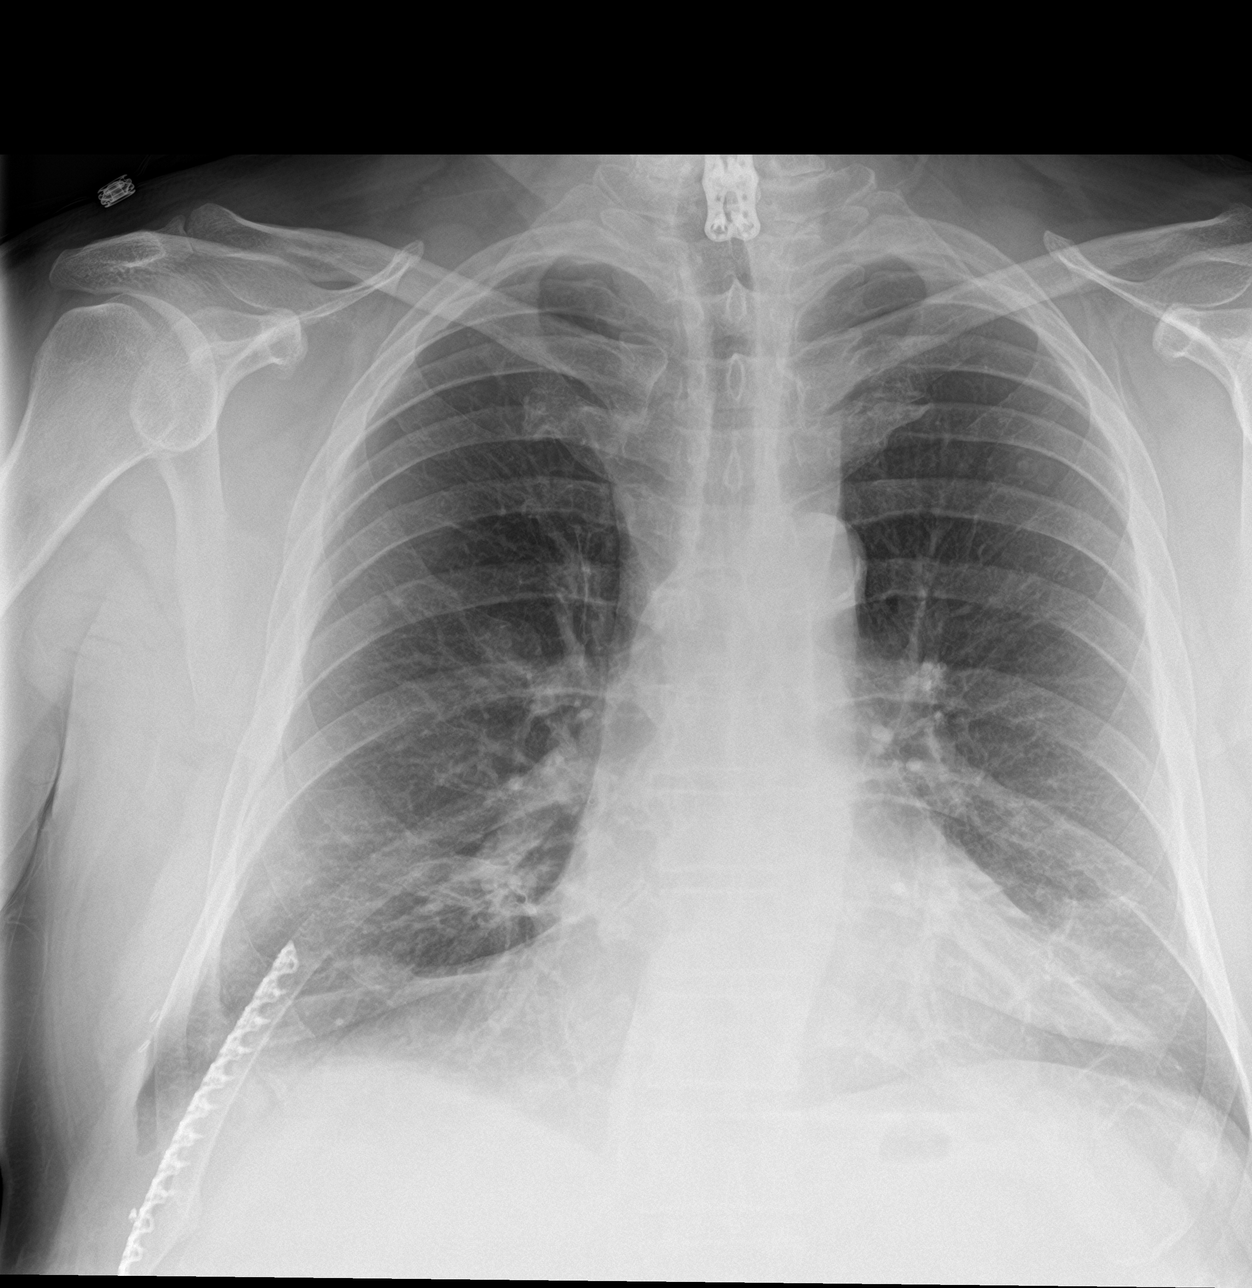

[3 of 3 positions shown; findings below may reference images not displayed]

FINDINGS: Heart size normal. Aortic atherosclerotic calcifications. No pleural
effusion or edema. No airspace consolidation or pneumothorax.
Previous plate and screw fixation of the right ninth rib.
IMPRESSION: 1. No acute cardiopulmonary abnormalities.
2.  Aortic Atherosclerosis ([id number]).

## 2024-03-29 ENCOUNTER — Encounter (HOSPITAL_COMMUNITY): Payer: Self-pay

## 2024-03-31 DIAGNOSIS — R9431 Abnormal electrocardiogram [ECG] [EKG]: Secondary | ICD-10-CM | POA: Diagnosis not present

## 2024-03-31 DIAGNOSIS — R001 Bradycardia, unspecified: Secondary | ICD-10-CM | POA: Diagnosis not present
